# Patient Record
Sex: Female | Born: 1960 | Race: White | Hispanic: No | Smoking: Former smoker
Health system: Southern US, Community
[De-identification: ages and names within clinical notes are randomized; demographics above are authoritative.]

## PROBLEM LIST (undated history)

## (undated) DIAGNOSIS — D649 Anemia, unspecified: Secondary | ICD-10-CM

## (undated) DIAGNOSIS — M19019 Primary osteoarthritis, unspecified shoulder: Secondary | ICD-10-CM

## (undated) DIAGNOSIS — J189 Pneumonia, unspecified organism: Secondary | ICD-10-CM

## (undated) DIAGNOSIS — K219 Gastro-esophageal reflux disease without esophagitis: Secondary | ICD-10-CM

## (undated) DIAGNOSIS — M25511 Pain in right shoulder: Secondary | ICD-10-CM

## (undated) DIAGNOSIS — K449 Diaphragmatic hernia without obstruction or gangrene: Secondary | ICD-10-CM

## (undated) DIAGNOSIS — G4730 Sleep apnea, unspecified: Secondary | ICD-10-CM

## (undated) DIAGNOSIS — M5030 Other cervical disc degeneration, unspecified cervical region: Secondary | ICD-10-CM

## (undated) DIAGNOSIS — M62838 Other muscle spasm: Secondary | ICD-10-CM

## (undated) DIAGNOSIS — Z9889 Other specified postprocedural states: Secondary | ICD-10-CM

## (undated) DIAGNOSIS — R112 Nausea with vomiting, unspecified: Secondary | ICD-10-CM

## (undated) HISTORY — PX: SHOULDER SURGERY: SHX246

## (undated) HISTORY — PX: FERTILITY SURGERY: SHX945

## (undated) HISTORY — PX: OTHER SURGICAL HISTORY: SHX169

## (undated) HISTORY — DX: Pneumonia, unspecified organism: J18.9

---

## 2005-11-22 ENCOUNTER — Encounter: Admission: RE | Admit: 2005-11-22 | Discharge: 2005-11-22 | Payer: Self-pay | Admitting: Internal Medicine

## 2005-11-30 ENCOUNTER — Other Ambulatory Visit: Admission: RE | Admit: 2005-11-30 | Discharge: 2005-11-30 | Payer: Self-pay | Admitting: Obstetrics and Gynecology

## 2005-12-05 ENCOUNTER — Encounter: Admission: RE | Admit: 2005-12-05 | Discharge: 2005-12-05 | Payer: Self-pay | Admitting: Internal Medicine

## 2006-01-18 ENCOUNTER — Emergency Department (HOSPITAL_COMMUNITY): Admission: EM | Admit: 2006-01-18 | Discharge: 2006-01-18 | Payer: Self-pay | Admitting: Emergency Medicine

## 2009-09-07 ENCOUNTER — Ambulatory Visit (HOSPITAL_COMMUNITY): Admission: RE | Admit: 2009-09-07 | Discharge: 2009-09-07 | Payer: Self-pay | Admitting: Internal Medicine

## 2009-09-11 ENCOUNTER — Emergency Department (HOSPITAL_COMMUNITY): Admission: EM | Admit: 2009-09-11 | Discharge: 2009-09-11 | Payer: Self-pay | Admitting: Emergency Medicine

## 2009-11-16 ENCOUNTER — Encounter: Payer: Self-pay | Admitting: Orthopedic Surgery

## 2009-11-16 ENCOUNTER — Ambulatory Visit (HOSPITAL_COMMUNITY): Admission: RE | Admit: 2009-11-16 | Discharge: 2009-11-16 | Payer: Self-pay | Admitting: Internal Medicine

## 2009-12-08 ENCOUNTER — Ambulatory Visit: Payer: Self-pay | Admitting: Orthopedic Surgery

## 2009-12-08 DIAGNOSIS — M19019 Primary osteoarthritis, unspecified shoulder: Secondary | ICD-10-CM | POA: Insufficient documentation

## 2009-12-12 ENCOUNTER — Encounter: Payer: Self-pay | Admitting: Orthopedic Surgery

## 2009-12-16 ENCOUNTER — Ambulatory Visit: Payer: Self-pay | Admitting: Orthopedic Surgery

## 2009-12-16 ENCOUNTER — Ambulatory Visit (HOSPITAL_COMMUNITY): Admission: RE | Admit: 2009-12-16 | Discharge: 2009-12-16 | Payer: Self-pay | Admitting: Orthopedic Surgery

## 2009-12-19 ENCOUNTER — Telehealth: Payer: Self-pay | Admitting: Orthopedic Surgery

## 2009-12-19 ENCOUNTER — Encounter: Payer: Self-pay | Admitting: Orthopedic Surgery

## 2009-12-20 ENCOUNTER — Ambulatory Visit: Payer: Self-pay | Admitting: Orthopedic Surgery

## 2009-12-20 DIAGNOSIS — M75120 Complete rotator cuff tear or rupture of unspecified shoulder, not specified as traumatic: Secondary | ICD-10-CM | POA: Insufficient documentation

## 2009-12-26 ENCOUNTER — Encounter (HOSPITAL_COMMUNITY): Admission: RE | Admit: 2009-12-26 | Discharge: 2010-01-25 | Payer: Self-pay | Admitting: Orthopedic Surgery

## 2009-12-26 ENCOUNTER — Encounter: Payer: Self-pay | Admitting: Orthopedic Surgery

## 2009-12-27 ENCOUNTER — Ambulatory Visit: Payer: Self-pay | Admitting: Orthopedic Surgery

## 2010-01-23 ENCOUNTER — Encounter: Payer: Self-pay | Admitting: Orthopedic Surgery

## 2010-01-25 ENCOUNTER — Ambulatory Visit: Payer: Self-pay | Admitting: Orthopedic Surgery

## 2010-03-08 ENCOUNTER — Ambulatory Visit: Payer: Self-pay | Admitting: Orthopedic Surgery

## 2010-08-10 ENCOUNTER — Ambulatory Visit (HOSPITAL_COMMUNITY): Admission: RE | Admit: 2010-08-10 | Discharge: 2010-08-10 | Payer: Self-pay | Admitting: Gastroenterology

## 2010-09-18 ENCOUNTER — Encounter: Payer: Self-pay | Admitting: Gastroenterology

## 2010-09-27 ENCOUNTER — Ambulatory Visit (HOSPITAL_COMMUNITY)
Admission: RE | Admit: 2010-09-27 | Discharge: 2010-09-27 | Payer: Self-pay | Source: Home / Self Care | Attending: Gastroenterology | Admitting: Gastroenterology

## 2010-09-27 HISTORY — PX: ESOPHAGOGASTRODUODENOSCOPY: SHX1529

## 2010-10-04 ENCOUNTER — Encounter (HOSPITAL_COMMUNITY)
Admission: RE | Admit: 2010-10-04 | Discharge: 2010-11-03 | Payer: Self-pay | Source: Home / Self Care | Attending: Gastroenterology | Admitting: Gastroenterology

## 2010-11-12 ENCOUNTER — Encounter: Payer: Self-pay | Admitting: Internal Medicine

## 2010-11-21 NOTE — Assessment & Plan Note (Signed)
Summary: 4 WK RE-CK SHOULDER/POST OP RC SURG 12/16/09/[facility] DISC/CAF   Visit Type:  Follow-up Referring Provider:  [facility] Primary Provider:  [facility]  CC:  post op shoulder.  History of Present Illness: I saw Suzzette Kresse in the office today for a followup visit.  She is a 50 years old woman with the complaint of:  DOS 12/16/09 right shoulder.  POD  42  Procedure  Arthroscopic subacromial decompression and distal clavicle excision and mini open rotator cuf repair right shoulder.  doing very well has near full range of motion in all planes no pain  Recommend home therapy with therapy and follow up 4 weeks        Allergies: 1)  ! Sulfa 2)  ! 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

## 2010-11-21 NOTE — Medication Information (Signed)
Summary: Copy of Tylenol #3 prescription  Copy of Tylenol #3 prescription   Imported By: Jacklynn Ganong 12/20/2009 11:53:03  _____________________________________________________________________  External Attachment:    Type:   Image     Comment:   External Document

## 2010-11-21 NOTE — Assessment & Plan Note (Signed)
Summary: 6 WK RE-CK SHOULDER/POST OP RC SURG 12/16/09/[facility] DISCOUNT/CAF   Visit Type:  Follow-up Referring Provider:  [facility] Primary Provider:  [facility]  CC:  post op shoulder.  History of Present Illness: 50 year old female had a RIGHT shoulder subacromial decompression and distal clavicle excision with mini open rotator cuff repair on December 16, 2009.  she is on a home exercise program this is postoperative #12 she has some discomfort and pain every now.        Allergies: 1)  ! Sulfa 2)  ! * Decadron  Physical Exam  Additional Exam:  examination RIGHT shoulder, skin incision is healed no tenderness no swelling or tenderness around the shoulder.  Full forward elevation.  Grade 5 motor strength in the supraspinatus tendon.  No impingement.     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

## 2010-11-21 NOTE — Progress Notes (Signed)
Summary: patient req order Tyl 3 Rx through pharmacy  Phone Note Call from Patient   Caller: Patient Action Taken: Patient advised to call 911 Summary of Call: Patient wants the Tylenol w/Codiene medication to be ordered through [facility] in [place], since unable to get through [facility] Initial call taken by: Cammie Sickle,  December 19, 2009 2:04 PM  Follow-up for Phone Call        faxed to [place] Follow-up by: Ether Griffins,  December 19, 2009 2:05 PM

## 2010-11-21 NOTE — Progress Notes (Signed)
Summary: [facility] does not order narcotic Rx   Phone Note From Other Clinic   Summary of Call: Olegario Messier from [facility] called back about prescriptions called in.  States that they do not order narcotics, as the Tylenol 3 Rx contains.  The patient may opt to pay for the prescription if she wishes to have filled on her own. At [facility]ras if any questions, or Olegario Messier, at [phone].    - I have contacted patient and have advised.  Patient states she gets nausea with codeine, but does want to get the Rx. Please advise. Initial call taken by: Cammie Sickle,  December 19, 2009 1:42 PM

## 2010-11-21 NOTE — Miscellaneous (Signed)
Summary: Rehab Report  Rehab Report   Imported By: Elvera Maria 01/06/2010 10:40:33  _____________________________________________________________________  External Attachment:    Type:   Image     Comment:   ot eval

## 2010-11-21 NOTE — Assessment & Plan Note (Signed)
Summary: POST OP 2/SURG 12/16/09/[facility] DISC/CAF   Visit Type:  Follow-up Referring Provider:  [facility] Primary Provider:  [facility]  CC:  POST OP 2 SHOULDER.  History of Present Illness: I saw Alyssa Romero in the office today for a followup visit.  She is a 50 years old woman with the complaint of:  DOS 12/16/09 right shoulder.  POD # 11  Procedure  Arthroscopic subacromial decompression and distal clavicle excision and mini open rotator cuf repair right shoulder.  Medication Ibuprofen 800mg  does not help much, but she is better, still has pain going down right arm anterior arm deep pain, has alot of pain in the am.  One week recheck right shoulder after PT   Patient has removed her sling  Patient demonstrated 90 of abduction in the RIGHT shoulder.  Incision looks good suture was clipped patient should continue therapy followup 4 weeks        Allergies: 1)  ! Sulfa 2)  ! 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

## 2010-11-21 NOTE — Assessment & Plan Note (Signed)
Summary: POST OP 1/RC RT/SURG 12/16/09/[facility] DISC/CAF   Visit Type:  Follow-up Referring Provider:  [facility] Primary Provider:  [facility]  CC:  post op 1 shoulder.  History of Present Illness: I saw Alyssa Romero in the office today for a followup visit.  She is a 50 years old woman with the complaint of:  DOS 12/16/09 right shoulder.  Procedure  Arthroscopic subacromial decompression and distal clavicle excision and mini open rotator cuf repair right shoulder.  Medication Tylenol 3 and Phenergan.  Subjectives post op 1 check incision.  all pain medicines make her nauseous we try Zofran and Phenergan she is now on ibuprofen 800 q.6 hours for pain  She is placed in a regular sling  She'll start physical therapy  Come back in a week for stitch removal   Allergies: 1)  ! Sulfa 2)  ! 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

## 2010-11-21 NOTE — Assessment & Plan Note (Signed)
Summary: RT SHOULDER PAIN/HAD MRI [facility],BRING'G FILM/100% [facility] DISC/REF FR...   Vital Signs:  Patient profile:   50 year old female Weight:      240 pounds Pulse rate:   80 / minute Resp:     16 per minute  Vitals Entered By: Fuller Canada MD (December 08, 2009 9:36 AM)  Visit Type:  Initial Consult Referring Provider:  [facility] Primary Provider:  [facility]  CC:  right shoulder pain.  History of Present Illness: I saw Alyssa Romero in the office today for an initial visit.  She is a 50 years old woman with the complaint of:  right shoulder pain.  The [facility] and she is on 100% [facility] discount  She complains of 2 months history of sharp and dull throbbing constant severe RIGHT shoulder pain worsening over time which started gradually and is unrelieved by ibuprofen 600.  Her symptoms are worse with lifting her arm twisting or arm or lying on her RIGHT shoulder  She does report numbness tingling and catching.  The pain is over the RIGHT shoulder and radiates into the upper arm around the deltoid.  She does a lot of farming.  She did get an injection of cortisone did not relieve her pain is not had any physical therapy.  She does complain of some pain over the a.c. joint as well.  She is already had an MRI. 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    Meds: Zantac, Motrin 600 as needed.   Will have xrays today.  Allergies (verified): 1)  ! Sulfa 2)  ! 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 Cardiac :  Denies chest pain, angina, heart attack, heart failure, poor circulation, blood clots, and phlebitis. Resp:  Denies short of breath, difficulty breathing, COPD, cough, and pneumonia. GI:  Complains of nausea, vomiting, and reflux; denies diarrhea, constipation, difficulty swallowing, ulcers, and GERD. GU:  Denies kidney failure, kidney transplant, kidney stones, burning, poor stream, testicular cancer, blood in urine, and . Neuro:  Denies headache, dizziness, migraines, numbness, weakness, tremor, and unsteady walking. MS:  Complains of joint pain; denies rheumatoid arthritis, joint swelling, gout, bone cancer, osteoporosis, and . Endo:  Denies thyroid disease, goiter, and diabetes. Psych:  Denies depression, mood swings, anxiety, panic attack, bipolar, and schizophrenia. Derm:  Denies eczema, cancer, and itching. EENT:  Denies poor vision, cataracts, glaucoma, poor hearing, vertigo, ears ringing, sinusitis, hoarseness, toothaches, and bleeding gums. Immunology:  Denies seasonal allergies, sinus problems, and allergic to bee stings. Lymphatic:  Denies lymph node cancer and lymph edema.  Physical Exam  Additional Exam:  GEN: well developed, well nourished, normal grooming and hygiene, no deformity and normal body habitus.   CDV: pulses are normal, no edema, no erythema. no tenderness  Lymph: normal lymph nodes   Skin: no rashes, skin lesions or open sores   NEURO: normal coordination, reflexes, sensation.   Psyche: awake, alert and oriented. Mood normal   Gait: normal  RIGHT shoulder tenderness noted over the a.c.  joint she has pain across the chest with adduction of the arm shows a positive impingement sign no instability painful range of motion which is limited in an improved after subacromial injection of lidocaine, motor exam seems normal.  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 joint. Arthroscopy RIGHT shoulder with open Mumford procedure  Orders: New Patient Level IV (09811) Shoulder x-ray,  minimum 2 views (91478)  2 views RIGHT shoulder show no evidence of type II or 3 acromion and normal glenohumeral joint   Joint Aspirate / Injection, Large (20610) Depo- Medrol 40mg  (J1030)  The RIGHT shoulder was injected Verbal consent obtained/The shoulder was injected with depomedrol 40mg /cc and sensorcaine .25% . There were no complications  Patient Instructions: 1)  DOS 12/16/09 2)  Preop at [facility] on Tuesday 12/13/09 at 12:45, take packet with you. 3)  Post op 1 in our office on 12/20/09

## 2010-11-21 NOTE — Letter (Signed)
Summary: EGD ORDER  EGD ORDER   Imported By: Ave Filter 09/18/2010 12:38:24  _____________________________________________________________________  External Attachment:    Type:   Image     Comment:   External Document

## 2010-11-21 NOTE — Letter (Signed)
Summary: History form  History form   Imported By: Jacklynn Ganong 12/14/2009 08:22:03  _____________________________________________________________________  External Attachment:    Type:   Image     Comment:   External Document

## 2010-11-21 NOTE — Miscellaneous (Signed)
Summary: OT Clinical evaluation  OT Clinical evaluation   Imported By: Jacklynn Ganong 01/09/2010 09:19:00  _____________________________________________________________________  External Attachment:    Type:   Image     Comment:   External Document

## 2010-11-21 NOTE — Letter (Signed)
Summary: Surgery order Rt shoulder sched 12/16/09  Surgery order Rt shoulder sched 12/16/09   Imported By: Cammie Sickle 01/17/2010 11:19:28  _____________________________________________________________________  External Attachment:    Type:   Image     Comment:   External Document

## 2010-11-21 NOTE — Progress Notes (Signed)
Summary: pain meds made her nauseaous  Phone Note Call from Patient   Summary of Call: Alyssa Romero (1961/03/15) says she cannot take the pain med you prescribed. She was taking the Hydrocodone and generic Robaxin but stopped both of them because one of them was giving her a severe headache and made her very nauseaous and the generic Zofran did not help the nausea.  Says she is in a lot of pain and needs you to prescibe something else for her pain.  Her [facility] [phone] Her # is [phone] Initial call taken by: Jacklynn Ganong,  December 19, 2009 10:49 AM  Follow-up for Phone Call        tylenol # 3  1 q 4 as needed pain  #30   [this is not as strong so she will still have some pain]  phenergan 25 mg q 6 scheduled  # 30  Follow-up by: Fuller Canada MD,  December 19, 2009 11:57 AM  Additional Follow-up for Phone Call Additional follow up Details #1::        Routed to [place] to call to [facility] [phone] Additional Follow-up by: Jacklynn Ganong,  December 19, 2009 12:31 PM    Additional Follow-up for Phone Call Additional follow up Details #2::    [facility] into [facility] clinic Follow-up by: Ether Griffins,  December 19, 2009 1:20 PM  New/Updated Medications: TYLENOL WITH CODEINE #3 300-30 MG TABS (ACETAMINOPHEN-CODEINE) one by mouth q 4 hrs as needed pain PROMETHAZINE HCL 25 MG TABS (PROMETHAZINE HCL) one by mouth q 6 hrs as needed Prescriptions: PROMETHAZINE HCL 25 MG TABS (PROMETHAZINE HCL) one by mouth q 6 hrs as needed  #30 x 0   Entered by:   Ether Griffins   Authorized by:   Fuller Canada MD   Signed by:   Ether Griffins on 12/19/2009   Method used:   Historical   RxID:   [id number] TYLENOL WITH CODEINE #3 300-30 MG TABS (ACETAMINOPHEN-CODEINE) one by mouth q 4 hrs as needed pain  #30 x 0   Entered by:   Ether Griffins   Authorized by:   Fuller Canada MD   Signed by:   Ether Griffins on 12/19/2009   Method used:   Historical   RxID:   [id number]

## 2010-11-21 NOTE — Medication Information (Signed)
Summary: Tax adviser   Imported By: Cammie Sickle 01/17/2010 11:17:21  _____________________________________________________________________  External Attachment:    Type:   Image     Comment:   External Document

## 2010-11-21 NOTE — Miscellaneous (Signed)
Summary: OT progress note  OT progress note   Imported By: Jacklynn Ganong 01/26/2010 07:58:11  _____________________________________________________________________  External Attachment:    Type:   Image     Comment:   External Document

## 2010-12-15 ENCOUNTER — Encounter (INDEPENDENT_AMBULATORY_CARE_PROVIDER_SITE_OTHER): Payer: Self-pay | Admitting: *Deleted

## 2010-12-19 NOTE — Letter (Signed)
Summary: Recall Office Visit  [facility]  [address]   [place], [place] [address]   Phone: [phone]  Fax: [phone]      December 15, 2010   [facility] [address] [place], [place]  [address] 1961-01-26   Dear Ms. Kosier,   According to our records, it is time for you to schedule a follow-up office visit with [place].   At your convenience, please call [phone] to schedule an office visit. If you have any questions, concerns, or feel that this letter is in error, we would appreciate your call.   Sincerely,    Diana Eves  [facility] Ph: [phone]   Fax: [phone]

## 2011-01-10 ENCOUNTER — Ambulatory Visit: Payer: Self-pay | Attending: Neurology

## 2011-01-10 DIAGNOSIS — R0989 Other specified symptoms and signs involving the circulatory and respiratory systems: Secondary | ICD-10-CM | POA: Insufficient documentation

## 2011-01-10 DIAGNOSIS — R0609 Other forms of dyspnea: Secondary | ICD-10-CM | POA: Insufficient documentation

## 2011-01-10 DIAGNOSIS — R5383 Other fatigue: Secondary | ICD-10-CM | POA: Insufficient documentation

## 2011-01-10 DIAGNOSIS — R5381 Other malaise: Secondary | ICD-10-CM | POA: Insufficient documentation

## 2011-01-10 DIAGNOSIS — G4733 Obstructive sleep apnea (adult) (pediatric): Secondary | ICD-10-CM | POA: Insufficient documentation

## 2011-01-11 LAB — BASIC METABOLIC PANEL
CO2: 31 mEq/L (ref 19–32)
Calcium: 9.4 mg/dL (ref 8.4–10.5)
Creatinine, Ser: 0.69 mg/dL (ref 0.4–1.2)
GFR calc Af Amer: >60 mL/min (ref >60)

## 2011-01-11 LAB — HEMOGLOBIN AND HEMATOCRIT, BLOOD
HCT: 40 % (ref 36.0–46.0)
Hemoglobin: 13.7 g/dL (ref 12.0–15.0)

## 2012-04-17 ENCOUNTER — Other Ambulatory Visit (HOSPITAL_COMMUNITY): Payer: Self-pay | Admitting: Internal Medicine

## 2012-04-17 DIAGNOSIS — R1310 Dysphagia, unspecified: Secondary | ICD-10-CM

## 2012-04-22 ENCOUNTER — Ambulatory Visit (HOSPITAL_COMMUNITY)
Admission: RE | Admit: 2012-04-22 | Discharge: 2012-04-22 | Disposition: A | Discharge status: Home / Self Care | Payer: Self-pay | Source: Ambulatory Visit | Attending: Internal Medicine | Admitting: Internal Medicine

## 2012-04-22 DIAGNOSIS — R4789 Other speech disturbances: Secondary | ICD-10-CM | POA: Insufficient documentation

## 2012-04-22 DIAGNOSIS — K219 Gastro-esophageal reflux disease without esophagitis: Secondary | ICD-10-CM | POA: Insufficient documentation

## 2012-04-22 DIAGNOSIS — K449 Diaphragmatic hernia without obstruction or gangrene: Secondary | ICD-10-CM | POA: Insufficient documentation

## 2012-04-22 DIAGNOSIS — R1310 Dysphagia, unspecified: Secondary | ICD-10-CM | POA: Insufficient documentation

## 2012-11-30 ENCOUNTER — Emergency Department (HOSPITAL_COMMUNITY): Payer: BC Managed Care – PPO

## 2012-11-30 ENCOUNTER — Emergency Department (HOSPITAL_COMMUNITY)
Admission: EM | Admit: 2012-11-30 | Discharge: 2012-11-30 | Disposition: A | Discharge status: Home / Self Care | Payer: BC Managed Care – PPO | Attending: Emergency Medicine | Admitting: Emergency Medicine

## 2012-11-30 ENCOUNTER — Encounter (HOSPITAL_COMMUNITY): Payer: Self-pay

## 2012-11-30 DIAGNOSIS — Y929 Unspecified place or not applicable: Secondary | ICD-10-CM | POA: Insufficient documentation

## 2012-11-30 DIAGNOSIS — W1789XA Other fall from one level to another, initial encounter: Secondary | ICD-10-CM | POA: Insufficient documentation

## 2012-11-30 DIAGNOSIS — Z79899 Other long term (current) drug therapy: Secondary | ICD-10-CM | POA: Insufficient documentation

## 2012-11-30 DIAGNOSIS — S82899A Other fracture of unspecified lower leg, initial encounter for closed fracture: Secondary | ICD-10-CM

## 2012-11-30 DIAGNOSIS — Y9301 Activity, walking, marching and hiking: Secondary | ICD-10-CM | POA: Insufficient documentation

## 2012-11-30 DIAGNOSIS — IMO0002 Reserved for concepts with insufficient information to code with codable children: Secondary | ICD-10-CM | POA: Insufficient documentation

## 2012-11-30 DIAGNOSIS — Z23 Encounter for immunization: Secondary | ICD-10-CM | POA: Insufficient documentation

## 2012-11-30 DIAGNOSIS — S80212A Abrasion, left knee, initial encounter: Secondary | ICD-10-CM

## 2012-11-30 MED ORDER — TETANUS-DIPHTH-ACELL PERTUSSIS 5-2.5-18.5 LF-MCG/0.5 IM SUSP
0.5000 mL | Freq: Once | INTRAMUSCULAR | Status: AC
  Administered 2012-11-30: 0.5 mL via INTRAMUSCULAR
  Filled 2012-11-30: qty 0.5

## 2012-11-30 NOTE — ED Notes (Signed)
Pt lost balance and fell injured left knee and right ankle. Denies loc. No other injuries

## 2012-11-30 NOTE — ED Provider Notes (Signed)
History     CSN: [id number]  Arrival date & time 11/30/12  1420   First MD Initiated Contact with Patient 11/30/12 1510      Chief Complaint  Patient presents with  . Fall    (Consider location/radiation/quality/duration/timing/severity/associated sxs/prior treatment) Patient is a 52 y.o. female presenting with fall. The history is provided by the patient.  Fall The accident occurred 1 to 2 hours ago. The fall occurred while walking. She fell from a height of 1 to 2 ft. She landed on dirt. The volume of blood lost was minimal. The point of impact was the left knee. The pain is present in the left knee (right ankle). The pain is moderate. She was ambulatory at the scene. Pertinent negatives include no visual change, no numbness, no abdominal pain, no bowel incontinence, no hematuria and no loss of consciousness. The symptoms are aggravated by ambulation and standing. She has tried nothing for the symptoms. The treatment provided no relief.    History reviewed. No pertinent past medical history.  Past Surgical History  Procedure Laterality Date  . Lymphectomy      No family history on file.  History  Substance Use Topics  . Smoking status: Not on file  . Smokeless tobacco: Not on file  . Alcohol Use: Yes     Comment: occ    OB History   Grav Para Term Preterm Abortions TAB SAB Ect Mult Living                  Review of Systems  Constitutional: Negative for activity change.       All ROS Neg except as noted in HPI  HENT: Negative for nosebleeds and neck pain.   Eyes: Negative for photophobia and discharge.  Respiratory: Negative for cough, shortness of breath and wheezing.   Cardiovascular: Negative for chest pain and palpitations.  Gastrointestinal: Negative for abdominal pain, blood in stool and bowel incontinence.  Genitourinary: Negative for dysuria, frequency and hematuria.  Musculoskeletal: Negative for back pain and arthralgias.  Skin: Negative.    Neurological: Negative for dizziness, seizures, loss of consciousness, speech difficulty and numbness.  Psychiatric/Behavioral: Negative for hallucinations and confusion.    Allergies  Decadron and Sulfonamide derivatives  Home Medications   Current Outpatient Rx  Name  Route  Sig  Dispense  Refill  . Cholecalciferol (VITAMIN D PO)   Oral   Take 1 tablet by mouth daily.         Marland Kitchen estradiol (VIVELLE-DOT) 0.05 MG/24HR   Transdermal   Place 0.5 patches onto the skin 2 (two) times a week.         Marland Kitchen liothyronine (CYTOMEL) 25 MCG tablet   Oral   Take 25 mcg by mouth daily.         . Multiple Vitamins-Minerals (MULTIVITAMINS THER. W/MINERALS) TABS   Oral   Take 1 tablet by mouth daily.         . NON FORMULARY   Oral   Take 1 capsule by mouth 2 (two) times daily. Compounded progesterone capsule         . Nutritional Supplements (DHEA PO)   Oral   Take 1 capsule by mouth daily.         . vitamin A 40981 UNIT capsule   Oral   Take 75,000 Units by mouth daily.           BP 119/71  Pulse 114  Temp(Src) 97.9 F (36.6 C) (Oral)  Resp 20  Ht 5\' 7"  (1.702 m)  Wt 220 lb (99.791 kg)  BMI 34.45 kg/m2  SpO2 98%  Physical Exam  Nursing note and vitals reviewed. Constitutional: She is oriented to person, place, and time. She appears well-developed and well-nourished.  Non-toxic appearance.  HENT:  Head: Normocephalic.  Right Ear: Tympanic membrane and external ear normal.  Left Ear: Tympanic membrane and external ear normal.  Eyes: EOM and lids are normal. Pupils are equal, round, and reactive to light.  Neck: Normal range of motion. Neck supple. Carotid bruit is not present.  Cardiovascular: Normal rate, regular rhythm, normal heart sounds, intact distal pulses and normal pulses.   Pulmonary/Chest: Breath sounds normal. No respiratory distress.  Abdominal: Soft. Bowel sounds are normal. There is no tenderness. There is no guarding.  Musculoskeletal: Normal  range of motion.  There are abrasions of the left knee. Leading [place] controlled. Is good range of motion of the left hip knee ankle and toes. The Achilles tendon is intact.  There is good range of motion of the right hip and knee. There is pain and swelling of the lateral malleolus on the right. There is pain with flexing and extending the toes. Is no swelling of the metatarsal heads. Capillary refill of the right and left toes is less than 3 seconds.  Lymphadenopathy:       Head (right side): No submandibular adenopathy present.       Head (left side): No submandibular adenopathy present.    She has no cervical adenopathy.  Neurological: She is alert and oriented to person, place, and time. She has normal strength. No cranial nerve deficit or sensory deficit.  Skin: Skin is warm and dry.  Psychiatric: She has a normal mood and affect. Her speech is normal.    ED Course  Procedures (including critical care time)  Labs Reviewed - No data to display Dg Ankle Complete Right  11/30/2012  *RADIOLOGY REPORT*  Clinical Data: Recent fall with swelling  RIGHT ANKLE - COMPLETE 3+ VIEW  Comparison: None.  Findings: No acute fracture is seen.  The ankle joint appears normal in alignment is normal.  There is soft tissue swelling of the lateral malleolus.  There does appear to be a thin bone fragment from the tip of the distal right fibula consistent with a small avulsion fracture fragment.  IMPRESSION:  Probable small avulsion fracture from the tip of the distal right fibula.   Original Report Authenticated By: Dwyane Dee, M.D.      No diagnosis found.    MDM  I have reviewed nursing notes, vital signs, and all appropriate lab and imaging results for this patient. The x-ray of the right ankle reveals a small avulsion fracture at the tip of the right fibula. The findings have been discussed with the patient in terms which he understands. The patient is fitted with a ankle stirrup splint and crutches. Ice  pack is also been provided. Pain management was offered to the patient. She requests to use extra strength Tylenol. Patient is advised to keep the right ankle elevated above the waist. Apply ice. Use crutches when up and about. And followup with orthopedics.       Kathie Dike, [place] 12/04/12 2210                                     Medical screening examination/treatment/procedure(s) were performed by non-physician practitioner and as supervising  physician I was immediately available for consultation/collaboration.   Benny Lennert, MD 01/15/13 [phone]

## 2013-09-17 ENCOUNTER — Emergency Department (HOSPITAL_COMMUNITY)
Admission: EM | Admit: 2013-09-17 | Discharge: 2013-09-17 | Disposition: A | Discharge status: Home / Self Care | Payer: BC Managed Care – PPO | Attending: Emergency Medicine | Admitting: Emergency Medicine

## 2013-09-17 ENCOUNTER — Encounter (HOSPITAL_COMMUNITY): Payer: Self-pay | Admitting: Emergency Medicine

## 2013-09-17 DIAGNOSIS — X58XXXA Exposure to other specified factors, initial encounter: Secondary | ICD-10-CM | POA: Insufficient documentation

## 2013-09-17 DIAGNOSIS — M546 Pain in thoracic spine: Secondary | ICD-10-CM | POA: Insufficient documentation

## 2013-09-17 DIAGNOSIS — IMO0002 Reserved for concepts with insufficient information to code with codable children: Secondary | ICD-10-CM | POA: Insufficient documentation

## 2013-09-17 DIAGNOSIS — Z79899 Other long term (current) drug therapy: Secondary | ICD-10-CM | POA: Insufficient documentation

## 2013-09-17 DIAGNOSIS — Y929 Unspecified place or not applicable: Secondary | ICD-10-CM | POA: Insufficient documentation

## 2013-09-17 DIAGNOSIS — F411 Generalized anxiety disorder: Secondary | ICD-10-CM | POA: Insufficient documentation

## 2013-09-17 DIAGNOSIS — Y939 Activity, unspecified: Secondary | ICD-10-CM | POA: Insufficient documentation

## 2013-09-17 DIAGNOSIS — T148XXA Other injury of unspecified body region, initial encounter: Secondary | ICD-10-CM

## 2013-09-17 MED ORDER — ONDANSETRON HCL 4 MG PO TABS: 4.0000 mg | ORAL_TABLET | Freq: Four times a day (QID) | ORAL | Status: DC

## 2013-09-17 MED ORDER — HYDROCODONE-ACETAMINOPHEN 5-325 MG PO TABS: 1.0000 | ORAL_TABLET | ORAL | Status: DC | PRN

## 2013-09-17 MED ORDER — LORAZEPAM 1 MG PO TABS
1.0000 mg | ORAL_TABLET | Freq: Once | ORAL | Status: AC
  Administered 2013-09-17: 1 mg via ORAL
  Filled 2013-09-17: qty 1

## 2013-09-17 MED ORDER — ONDANSETRON 4 MG PO TBDP
4.0000 mg | ORAL_TABLET | Freq: Once | ORAL | Status: AC
  Administered 2013-09-17: 4 mg via ORAL
  Filled 2013-09-17: qty 1

## 2013-09-17 MED ORDER — HYDROMORPHONE HCL PF 1 MG/ML IJ SOLN
1.0000 mg | Freq: Once | INTRAMUSCULAR | Status: AC
  Administered 2013-09-17: 1 mg via INTRAMUSCULAR
  Filled 2013-09-17: qty 1

## 2013-09-17 MED ORDER — IBUPROFEN 400 MG PO TABS
600.0000 mg | ORAL_TABLET | Freq: Once | ORAL | Status: AC
  Administered 2013-09-17: 600 mg via ORAL
  Filled 2013-09-17: qty 2

## 2013-09-17 MED ORDER — IBUPROFEN 600 MG PO TABS: 600.0000 mg | ORAL_TABLET | Freq: Four times a day (QID) | ORAL | Status: DC | PRN

## 2013-09-17 NOTE — ED Provider Notes (Signed)
7:05 AM  Assumed care from Dr. Ranae Palms.  Pt here with left-sided posterior shoulder muscle strain, spasm. Tender to palpation over her left posterior trapezius and rhomboid musculature. No bony tenderness. NV intact.  No imaging indicated at this time. We'll give pain medication and reassess.  7:36 PM  Patient's pain is markedly improved and she states is much more bearable. She describes as a 2/10. She reports she is ready for discharge. Have instructed her to followup with her primary care physician next week if her pain continues. Given return precautions. We'll discharge home with Norco, ibuprofen. Patient verbalizes understanding and is comfortable with plan.  Alyssa Romero Ward, DO 09/17/13 [phone]

## 2013-09-17 NOTE — ED Provider Notes (Signed)
CSN: [id number]     Arrival date & time 09/17/13  0601 History   First MD Initiated Contact with Patient 09/17/13 [phone]     Chief Complaint  Patient presents with  . Shoulder Pain    pinched nerve to left shoulder per patient. went to urgent care and was given flexeril and steroids and tylenol for pain.   (Consider location/radiation/quality/duration/timing/severity/associated sxs/prior Treatment) HPI Patient presents with left sided thoracic back pain radiating into her left shoulder. She's had the pain now for 4-5 days. She seen a chiropractor and she has been to an urgent care clinic. She's had x-rays performed which showed no bony abnormality. She was told she may have a pinched nerve. She has no shortness of breath or chest pain. Patient is quite anxious. She has no weakness or numbness in her hands or upper extremity she is taking Flexeril with little benefit. No past medical history on file. Past Surgical History  Procedure Laterality Date  . Lymphectomy     No family history on file. History  Substance Use Topics  . Smoking status: Not on file  . Smokeless tobacco: Not on file  . Alcohol Use: Yes     Comment: occ   OB History   Grav Para Term Preterm Abortions TAB SAB Ect Mult Living                 Review of Systems  Constitutional: Negative for fever and chills.  Respiratory: Negative for shortness of breath.   Cardiovascular: Negative for chest pain.  Gastrointestinal: Negative for nausea, vomiting and abdominal pain.  Musculoskeletal: Positive for back pain and myalgias. Negative for arthralgias, neck pain and neck stiffness.  Skin: Negative for rash and wound.  Neurological: Negative for dizziness, weakness, light-headedness, numbness and headaches.  All other systems reviewed and are negative.    Allergies  Decadron and Sulfonamide derivatives  Home Medications   Current Outpatient Rx  Name  Route  Sig  Dispense  Refill  . cyclobenzaprine (FLEXERIL) 10 MG  tablet   Oral   Take 10 mg by mouth 3 (three) times daily as needed for muscle spasms.         . Cholecalciferol (VITAMIN D PO)   Oral   Take 1 tablet by mouth daily.         Marland Kitchen estradiol (VIVELLE-DOT) 0.05 MG/24HR   Transdermal   Place 0.5 patches onto the skin 2 (two) times a week.         Marland Kitchen liothyronine (CYTOMEL) 25 MCG tablet   Oral   Take 25 mcg by mouth daily.         . Multiple Vitamins-Minerals (MULTIVITAMINS THER. W/MINERALS) TABS   Oral   Take 1 tablet by mouth daily.         . NON FORMULARY   Oral   Take 1 capsule by mouth 2 (two) times daily. Compounded progesterone capsule         . Nutritional Supplements (DHEA PO)   Oral   Take 1 capsule by mouth daily.         . vitamin A 56213 UNIT capsule   Oral   Take 75,000 Units by mouth daily.          BP 144/85  Pulse 94  Temp(Src) 98.4 F (36.9 C) (Oral)  Resp 20  Ht 5\' 7"  (1.702 m)  Wt 229 lb (103.874 kg)  BMI 35.86 kg/m2  SpO2 98% Physical Exam  Nursing note and vitals reviewed.  Constitutional: She is oriented to person, place, and time. She appears well-developed and well-nourished. No distress.  Patient is anxious and appears histrionic. She is tearful and walking around the room.  HENT:  Head: Normocephalic and atraumatic.  Mouth/Throat: Oropharynx is clear and moist.  Eyes: EOM are normal. Pupils are equal, round, and reactive to light.  Neck: Normal range of motion. Neck supple.  Patient has no midline cervical tenderness. She has no paraspinal cervical tenderness. She has full range of motion of her neck.  Cardiovascular: Normal rate and regular rhythm.   Pulmonary/Chest: Effort normal and breath sounds normal. No respiratory distress. She has no wheezes. She has no rales. She exhibits no tenderness.  Abdominal: Soft. Bowel sounds are normal. She exhibits no distension and no mass. There is no tenderness. There is no rebound and no guarding.  Musculoskeletal: Normal range of motion.  She exhibits tenderness (patient's pain is reproduced with palpation along the left trapezius and left rhomboid muscles. She has full range of motion of her shoulder without pain. Her distal pulses are intact.). She exhibits no edema.  Neurological: She is alert and oriented to person, place, and time.  She has 5/5 grip strength in both hands. Her sensation is fully intact.  Skin: Skin is warm and dry. No rash noted. No erythema.    ED Course  Procedures (including critical care time) Labs Review Labs Reviewed - No data to display Imaging Review No results found.  EKG Interpretation   None       MDM  There appears to be some psychiatric component the patient's complaint. Her symptoms her reproduced with palpation over the thoracic musculature. I suspect muscle strain causing her pain. She states that she gets nauseated with pain medication. We'll pretreat with Zofran and give ibuprofen and a dose of Ativan. We'll reevaluate.  Patient signed out to oncoming emergency physician pending reevaluation and discharge.  Loren Racer, MD 09/18/13 0201

## 2013-09-19 ENCOUNTER — Encounter (HOSPITAL_COMMUNITY): Payer: Self-pay | Admitting: Emergency Medicine

## 2013-09-19 ENCOUNTER — Emergency Department (HOSPITAL_COMMUNITY)
Admission: EM | Admit: 2013-09-19 | Discharge: 2013-09-19 | Disposition: A | Discharge status: Home / Self Care | Payer: BC Managed Care – PPO | Attending: Emergency Medicine | Admitting: Emergency Medicine

## 2013-09-19 ENCOUNTER — Emergency Department (HOSPITAL_COMMUNITY): Payer: BC Managed Care – PPO

## 2013-09-19 DIAGNOSIS — M542 Cervicalgia: Secondary | ICD-10-CM | POA: Insufficient documentation

## 2013-09-19 DIAGNOSIS — M778 Other enthesopathies, not elsewhere classified: Secondary | ICD-10-CM

## 2013-09-19 DIAGNOSIS — M5090 Cervical disc disorder, unspecified, unspecified cervical region: Secondary | ICD-10-CM | POA: Insufficient documentation

## 2013-09-19 DIAGNOSIS — M5030 Other cervical disc degeneration, unspecified cervical region: Secondary | ICD-10-CM

## 2013-09-19 DIAGNOSIS — M19019 Primary osteoarthritis, unspecified shoulder: Secondary | ICD-10-CM | POA: Insufficient documentation

## 2013-09-19 DIAGNOSIS — M67919 Unspecified disorder of synovium and tendon, unspecified shoulder: Secondary | ICD-10-CM | POA: Insufficient documentation

## 2013-09-19 DIAGNOSIS — M719 Bursopathy, unspecified: Secondary | ICD-10-CM | POA: Insufficient documentation

## 2013-09-19 DIAGNOSIS — Z8719 Personal history of other diseases of the digestive system: Secondary | ICD-10-CM | POA: Insufficient documentation

## 2013-09-19 HISTORY — DX: Gastro-esophageal reflux disease without esophagitis: K21.9

## 2013-09-19 HISTORY — DX: Other cervical disc degeneration, unspecified cervical region: M50.30

## 2013-09-19 HISTORY — DX: Diaphragmatic hernia without obstruction or gangrene: K44.9

## 2013-09-19 HISTORY — DX: Pain in right shoulder: M25.511

## 2013-09-19 HISTORY — DX: Other muscle spasm: M62.838

## 2013-09-19 HISTORY — DX: Primary osteoarthritis, unspecified shoulder: M19.019

## 2013-09-19 MED ORDER — ONDANSETRON HCL 4 MG PO TABS: 4.0000 mg | ORAL_TABLET | Freq: Three times a day (TID) | ORAL | Status: DC | PRN

## 2013-09-19 MED ORDER — FENTANYL CITRATE 0.05 MG/ML IJ SOLN
100.0000 ug | Freq: Once | INTRAMUSCULAR | Status: AC
  Administered 2013-09-19: 100 ug via INTRAMUSCULAR
  Filled 2013-09-19: qty 2

## 2013-09-19 MED ORDER — METHOCARBAMOL 500 MG PO TABS: 1000.0000 mg | ORAL_TABLET | Freq: Four times a day (QID) | ORAL | Status: DC | PRN

## 2013-09-19 MED ORDER — OXYCODONE-ACETAMINOPHEN 5-325 MG PO TABS: ORAL_TABLET | ORAL | Status: DC

## 2013-09-19 NOTE — ED Provider Notes (Signed)
CSN: [id number]     Arrival date & time 09/19/13  1422 History   First MD Initiated Contact with Patient 09/19/13 1638     Chief Complaint  Patient presents with  . Shoulder Pain    HPI Pt was seen at 1645. Per pt and her husband, c/o gradual onset and persistence of constant left sided posterior neck and shoulder "pain" for the past 1 week. States she woke up with symptoms. Describes the pain as "stabbing" and "burning."  Pain worsens with palpation of the area, movement of her left arm, and body position changes. Denies incont/retention of bowel or bladder, no saddle anesthesia, no focal motor weakness, no tingling/numbness in extremities, no visual changes, no facial droop, no slurred speech, no fevers, no rash, no injury, no CP/SOB pain. The symptoms have been associated with no other complaints. The patient has a significant history of similar symptoms previously, recently being evaluated for this complaint and multiple prior evals for same. Pt has been evaluated at an [facility], the ED and by her Chiropractor in the past week for these symptoms. Pt's family states pt's chiropractor "told her she might have a pinched nerve and a bone spur in her shoulder."      Ortho: Dr. Romeo Apple Past Medical History  Diagnosis Date  . Hiatal hernia   . GERD (gastroesophageal reflux disease)   . Pain in right shoulder   . Muscle spasm     "she has recurrent muscle spasms all over for as long as I've known her" per husband   Past Surgical History  Procedure Laterality Date  . Lymphectomy    . Shoulder surgery Right     History  Substance Use Topics  . Smoking status: Never Smoker   . Smokeless tobacco: Not on file  . Alcohol Use: Yes     Comment: occ    Review of Systems ROS: Statement: All systems negative except as marked or noted in the HPI; Constitutional: Negative for fever and chills. ; ; Eyes: Negative for eye pain, redness and discharge. ; ; ENMT: Negative for ear pain, hoarseness, nasal  congestion, sinus pressure and sore throat. ; ; Cardiovascular: Negative for chest pain, palpitations, diaphoresis, dyspnea and peripheral edema. ; ; Respiratory: Negative for cough, wheezing and stridor. ; ; Gastrointestinal: Negative for nausea, vomiting, diarrhea, abdominal pain, blood in stool, hematemesis, jaundice and rectal bleeding. . ; ; Genitourinary: Negative for dysuria, flank pain and hematuria. ; ; Musculoskeletal: +left neck and shoulder pain. Negative for back pain. Negative for swelling and trauma.; ; Skin: Negative for pruritus, rash, abrasions, blisters, bruising and skin lesion.; ; Neuro: Negative for headache, lightheadedness and neck stiffness. Negative for weakness, altered level of consciousness , altered mental status, extremity weakness, paresthesias, involuntary movement, seizure and syncope.       Allergies  Decadron and Sulfonamide derivatives  Home Medications   Current Outpatient Rx  Name  Route  Sig  Dispense  Refill  . cyclobenzaprine (FLEXERIL) 10 MG tablet   Oral   Take 10 mg by mouth 3 (three) times daily as needed for muscle spasms.         Marland Kitchen HYDROcodone-acetaminophen (NORCO) 5-325 MG per tablet   Oral   Take 1 tablet by mouth every 4 (four) hours as needed.   10 tablet   0   . ibuprofen (ADVIL,MOTRIN) 600 MG tablet   Oral   Take 1 tablet (600 mg total) by mouth every 6 (six) hours as needed.   30  tablet   0   . ondansetron (ZOFRAN) 4 MG tablet   Oral   Take 1 tablet (4 mg total) by mouth every 6 (six) hours.   12 tablet   0    BP 97/65  Pulse 90  Temp(Src) 97.1 F (36.2 C) (Oral)  Resp 18  Ht 5\' 7"  (1.702 m)  Wt 219 lb (99.338 kg)  BMI 34.29 kg/m2  SpO2 100% Physical Exam 1650: Physical examination:  Nursing notes reviewed; Vital signs and O2 SAT reviewed;  Constitutional: Well developed, Well nourished, Well hydrated, Tearful.; Head:  Normocephalic, atraumatic; Eyes: EOMI, PERRL, No scleral icterus; ENMT: Mouth and pharynx normal,  Mucous membranes moist; Neck: Supple, Full range of motion, No lymphadenopathy; Cardiovascular: Regular rate and rhythm, No gallop; Respiratory: Breath sounds clear & equal bilaterally, No wheezes.  Speaking full sentences with ease, Normal respiratory effort/excursion; Chest: Nontender, Movement normal; Abdomen: Soft, Nontender, Nondistended, Normal bowel sounds; Genitourinary: No CVA tenderness; Spine:  No midline CS, TS, LS tenderness. +TTP left hypertonic trapezius and rhomboid muscles. No rash.;; Extremities: Pulses normal, No tenderness left shoulder joint, FROM left shoulder. NMS intact bilat UE's. No edema, No calf edema or asymmetry.; Neuro: AA&Ox3, Major CN grossly intact.  Speech clear. No gross focal motor or sensory deficits in extremities.; Skin: Color normal, Warm, Dry.   ED Course  Procedures   EKG Interpretation   None       MDM  MDM Reviewed: previous chart, nursing note and vitals Interpretation: x-ray and CT scan    Ct Cervical Spine Wo Contrast 09/19/2013   CLINICAL DATA:  52 year old female with neck and left shoulder pain.  EXAM: CT CERVICAL SPINE WITHOUT CONTRAST  TECHNIQUE: Multidetector CT imaging of the cervical spine was performed without intravenous contrast. Multiplanar CT image reconstructions were also generated.  COMPARISON:  None.  FINDINGS: Normal alignment is noted.  There is no evidence of fracture, subluxation or prevertebral soft tissue swelling.  C2-3: Minimal degenerative disc disease and a small central disc protrusion noted which may barely contact the cord.  C3-4: Mild to moderate degenerative disc disease, spondylosis and mild broad-based disc bulge noted contributing to mild central spinal narrowing, mild to moderate left bony foraminal narrowing and mild right bony foraminal narrowing.  C4-5: Minimal degenerative disc disease and spondylosis noted with mild broad-based disc bulge contributing to mild central spinal and right bony foraminal narrowing.   C5-6: Minimal degenerative disc disease noted with mild broad-based disc bulge.  C6-C7: Mild-to-moderate degenerative disc disease and spondylosis noted. A broad-based disc bulge is noted with suggestion of a small to moderate focal left paracentral disc protrusion. Mild central spinal narrowing is present.  C7-T1: Minimal degenerative disc disease noted without central or foraminal narrowing.  IMPRESSION: Suggestion of small to moderate focal left paracentral disc protrusion at C6-C7 likely impinging on the adjacent left nerve root. Consider MRI for further evaluation as clinically indicated.  Multilevel degenerative changes contributing to central and foraminal narrowing as described above. At C3-C4, there is mild to moderate left bony foraminal narrowing.  No evidence of acute abnormality.   Electronically Signed   By: Laveda Abbe M.D.   On: 09/19/2013 17:50   Dg Shoulder Left 09/19/2013   CLINICAL DATA:  Left shoulder pain.  EXAM: LEFT SHOULDER - 2+ VIEW  COMPARISON:  CT chest 11/22/2005.  FINDINGS: No evidence of fracture, dislocation, or separation. Mild subacromial spurring present. Punctate calcification noted over the distribution of the supraspinatus tendon. Calcific supraspinatus tendinitis could present in  this fashion. Visualized left lung apex is normal. Adjacent left ribs are unremarkable.  IMPRESSION: 1. Mild acromioclavicular degenerative change with mild subacromial spurring. 2. Small calcifications in the distribution the supraspinatus tendon, calcific supraspinatus tendinitis could present in this fashion.   Electronically Signed   By: Maisie Fus  Register   On: 09/19/2013 17:38     1820:  Improved after pain meds. Neuro exam intact and unchanged. Wants to go home now. Will continue to tx symptomatically at this time. Dx and testing d/w pt and family.  Questions answered.  Verb understanding, agreeable to d/c home with outpt f/u.     Laray Anger, DO 09/22/13 1447

## 2013-09-19 NOTE — ED Notes (Signed)
Pt reports that she woke last Sunday morning, with severe left shoulder pain, has been told she has a pinched nerve.  Cont. To have pain to the area.  The only thing that has helped "was the shot she got on her last visit".  Has been taking her meds as prescribed, but severe pain has returned.

## 2013-09-19 NOTE — ED Notes (Signed)
Pt alert & oriented x4, stable gait. Patient  given discharge instructions, paperwork & prescription(s). Patient verbalized understanding. Pt left department w/ no further questions. 

## 2013-09-22 ENCOUNTER — Ambulatory Visit: Payer: BC Managed Care – PPO | Admitting: Orthopedic Surgery

## 2014-02-04 ENCOUNTER — Encounter: Payer: Self-pay | Admitting: Orthopedic Surgery

## 2014-02-04 ENCOUNTER — Ambulatory Visit (INDEPENDENT_AMBULATORY_CARE_PROVIDER_SITE_OTHER): Payer: BC Managed Care – PPO | Admitting: Orthopedic Surgery

## 2014-02-04 ENCOUNTER — Ambulatory Visit (INDEPENDENT_AMBULATORY_CARE_PROVIDER_SITE_OTHER): Payer: BC Managed Care – PPO

## 2014-02-04 VITALS — BP 118/77 | Ht 68.0 in | Wt 239.0 lb

## 2014-02-04 DIAGNOSIS — M25559 Pain in unspecified hip: Secondary | ICD-10-CM

## 2014-02-04 DIAGNOSIS — M549 Dorsalgia, unspecified: Secondary | ICD-10-CM

## 2014-02-04 DIAGNOSIS — M4716 Other spondylosis with myelopathy, lumbar region: Secondary | ICD-10-CM

## 2014-02-04 NOTE — Progress Notes (Signed)
Patient ID: Alyssa Romero, female   DOB: [birth year], 53 y.o.   MRN: [id number]  Chief Complaint  Patient presents with  . Hip Pain    Right hip pain. Consult from Dr. Ladona Ridgelaylor    This is a 53 year old female referred to us by Dr. Reatha HarpsMatthew Taylor for bilateral hip pain right greater than left with history of lumbar disc disease and radicular symptoms into the right leg for which he has treated. There is question of hip arthritis/bursitis. She complains of anterior hip pain with burning pain described as 8/10 worse when lying on her side and especially worse after sitting. She has difficulty walking after getting up from a seated position. She has pain when hiking, climbing. 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 No tenderness around the hip no pain with range of motion normal Lachman maneuvers. Full range of motion All joints are stable Motor exam is normal Skin clean dry and intact  Cardiovascular exam is normal Sensory exam normal  Imaging shows spondylosis of the lumbar spine including L1-L2 which may explain some of her hip  pain. Imaging of the pelvis shows normal hip joints bilaterally  Impression lumbar spondylosis  Recommend MRI  This patient has had back and hip pain for greater than 6 weeks and she's had treatment including ibuprofen and physical therapy and chiropractic manipulation she has failed to improve has persistent radicular pain in the right leg and will benefit from MRI to assess for surgical intervention versus epidural injection.

## 2014-02-04 NOTE — Patient Instructions (Signed)
MRI Ordered. 

## 2014-02-19 ENCOUNTER — Ambulatory Visit (HOSPITAL_COMMUNITY)
Admission: RE | Admit: 2014-02-19 | Discharge: 2014-02-19 | Disposition: A | Discharge status: Home / Self Care | Payer: BC Managed Care – PPO | Source: Ambulatory Visit | Attending: Orthopedic Surgery | Admitting: Orthopedic Surgery

## 2014-02-19 ENCOUNTER — Encounter (HOSPITAL_COMMUNITY): Payer: Self-pay

## 2014-02-19 DIAGNOSIS — M549 Dorsalgia, unspecified: Secondary | ICD-10-CM

## 2014-02-19 DIAGNOSIS — M51379 Other intervertebral disc degeneration, lumbosacral region without mention of lumbar back pain or lower extremity pain: Secondary | ICD-10-CM | POA: Insufficient documentation

## 2014-02-19 DIAGNOSIS — M5126 Other intervertebral disc displacement, lumbar region: Secondary | ICD-10-CM | POA: Insufficient documentation

## 2014-02-19 DIAGNOSIS — M5137 Other intervertebral disc degeneration, lumbosacral region: Secondary | ICD-10-CM | POA: Insufficient documentation

## 2014-02-19 DIAGNOSIS — M4716 Other spondylosis with myelopathy, lumbar region: Secondary | ICD-10-CM

## 2014-02-19 DIAGNOSIS — M25559 Pain in unspecified hip: Secondary | ICD-10-CM | POA: Insufficient documentation

## 2014-03-03 ENCOUNTER — Telehealth: Payer: Self-pay | Admitting: Orthopedic Surgery

## 2014-03-03 NOTE — Telephone Encounter (Signed)
Routing to Dr Harrison 

## 2014-05-05 ENCOUNTER — Emergency Department (HOSPITAL_COMMUNITY): Payer: BC Managed Care – PPO

## 2014-05-05 ENCOUNTER — Encounter (HOSPITAL_COMMUNITY): Payer: Self-pay | Admitting: Emergency Medicine

## 2014-05-05 ENCOUNTER — Emergency Department (HOSPITAL_COMMUNITY)
Admission: EM | Admit: 2014-05-05 | Discharge: 2014-05-05 | Disposition: A | Discharge status: Home / Self Care | Payer: BC Managed Care – PPO | Attending: Emergency Medicine | Admitting: Emergency Medicine

## 2014-05-05 DIAGNOSIS — Z8781 Personal history of (healed) traumatic fracture: Secondary | ICD-10-CM | POA: Insufficient documentation

## 2014-05-05 DIAGNOSIS — Y929 Unspecified place or not applicable: Secondary | ICD-10-CM | POA: Insufficient documentation

## 2014-05-05 DIAGNOSIS — S9000XA Contusion of unspecified ankle, initial encounter: Secondary | ICD-10-CM | POA: Insufficient documentation

## 2014-05-05 DIAGNOSIS — M19019 Primary osteoarthritis, unspecified shoulder: Secondary | ICD-10-CM | POA: Insufficient documentation

## 2014-05-05 DIAGNOSIS — Z79899 Other long term (current) drug therapy: Secondary | ICD-10-CM | POA: Insufficient documentation

## 2014-05-05 DIAGNOSIS — Y9301 Activity, walking, marching and hiking: Secondary | ICD-10-CM | POA: Insufficient documentation

## 2014-05-05 DIAGNOSIS — X500XXA Overexertion from strenuous movement or load, initial encounter: Secondary | ICD-10-CM | POA: Insufficient documentation

## 2014-05-05 DIAGNOSIS — S99911A Unspecified injury of right ankle, initial encounter: Secondary | ICD-10-CM

## 2014-05-05 DIAGNOSIS — R000 Tachycardia, unspecified: Secondary | ICD-10-CM | POA: Insufficient documentation

## 2014-05-05 DIAGNOSIS — Z8719 Personal history of other diseases of the digestive system: Secondary | ICD-10-CM | POA: Insufficient documentation

## 2014-05-05 MED ORDER — OXYCODONE-ACETAMINOPHEN 5-325 MG PO TABS: 1.0000 | ORAL_TABLET | ORAL | Status: DC | PRN

## 2014-05-05 NOTE — ED Provider Notes (Signed)
CSN: [id number]     Arrival date & time 05/05/14  2018 History   First MD Initiated Contact with Patient 05/05/14 2157     Chief Complaint  Patient presents with  . Ankle Injury     (Consider location/radiation/quality/duration/timing/severity/associated sxs/prior Treatment) Patient is a 53 y.o. female presenting with lower extremity injury. The history is provided by the patient.  Ankle Injury This is a new problem. The current episode started today. The problem occurs constantly. The problem has been gradually worsening.   Clide Deutschernna M Preble is a 53 y.o. female who presents to the ED with right ankle pain. She was walking and turned her ankle and felt a pop. The pain is severe. She states that last year she had a similar injury and had an avulsion fracture of the ankle. She was followed by Dr. Hilda LiasKeeling. She was afraid that she had done the same thing again. She denies any other injuries or problems tonight.   Past Medical History  Diagnosis Date  . Hiatal hernia   . GERD (gastroesophageal reflux disease)   . Pain in right shoulder   . Muscle spasm     "she has recurrent muscle spasms all over for as long as I've known her" per husband  . DDD (degenerative disc disease), cervical   . DJD of shoulder     left   Past Surgical History  Procedure Laterality Date  . Lymphectomy    . Shoulder surgery Right    No family history on file. History  Substance Use Topics  . Smoking status: Never Smoker   . Smokeless tobacco: Not on file  . Alcohol Use: Yes     Comment: occ   OB History   Grav Para Term Preterm Abortions TAB SAB Ect Mult Living                 Review of Systems Negative except as stated in HPI   Allergies  Decadron and Sulfonamide derivatives  Home Medications   Prior to Admission medications   Medication Sig Start Date End Date Taking? Authorizing Provider  cyclobenzaprine (FLEXERIL) 10 MG tablet Take 10 mg by mouth 3 (three) times daily as needed for muscle  spasms.    Historical Provider, MD  HYDROcodone-acetaminophen (NORCO) 5-325 MG per tablet Take 1 tablet by mouth every 4 (four) hours as needed. 09/17/13   Loren Raceravid Yelverton, MD  ibuprofen (ADVIL,MOTRIN) 600 MG tablet Take 1 tablet (600 mg total) by mouth every 6 (six) hours as needed. 09/17/13   Loren Raceravid Yelverton, MD  methocarbamol (ROBAXIN) 500 MG tablet Take 2 tablets (1,000 mg total) by mouth 4 (four) times daily as needed for muscle spasms (muscle spasm/pain). 09/19/13   Laray AngerKathleen M McManus, DO  ondansetron (ZOFRAN) 4 MG tablet Take 1 tablet (4 mg total) by mouth every 6 (six) hours. 09/17/13   Kristen N Ward, DO  ondansetron (ZOFRAN) 4 MG tablet Take 1 tablet (4 mg total) by mouth every 8 (eight) hours as needed for nausea or vomiting. 09/19/13   Laray AngerKathleen M McManus, DO  oxyCODONE-acetaminophen (PERCOCET/ROXICET) 5-325 MG per tablet 1 or 2 tabs PO q6h prn pain 09/19/13   Laray AngerKathleen M McManus, DO   BP 148/87  Pulse 107  Temp(Src) 99.4 F (37.4 C) (Oral)  Resp 20  Ht 5\' 9"  (1.753 m)  Wt 230 lb (104.327 kg)  BMI 33.95 kg/m2  SpO2 97% Physical Exam  Nursing note and vitals reviewed. Constitutional: She is oriented to person, place, and time. She  appears well-developed and well-nourished. No distress.  HENT:  Head: Normocephalic.  Eyes: EOM are normal.  Neck: Neck supple.  Cardiovascular: Tachycardia present.   Pulmonary/Chest: Effort normal.  Abdominal: Soft. There is no tenderness.  Musculoskeletal:       Right ankle: She exhibits swelling and ecchymosis. She exhibits no deformity, no laceration and normal pulse. Decreased range of motion: du to pain. Tenderness. Lateral malleolus tenderness found. Achilles tendon normal.  Pedal pulses equal, adequate circulation, good touch sensation.   Neurological: She is alert and oriented to person, place, and time. No cranial nerve deficit.  Skin: Skin is warm and dry.  Psychiatric: She has a normal mood and affect. Her behavior is normal.    ED  Course  Procedures (including critical care time) Labs Review Labs Reviewed - No data to display  Imaging Review Dg Ankle Complete Right  05/05/2014   CLINICAL DATA:  Ankle injury. The patient fell and heard a pop in her right ankle. Lateral pain and swelling.  EXAM: RIGHT ANKLE - COMPLETE 3+ VIEW  COMPARISON:  Right ankle radiographs 11/30/2012  FINDINGS: Soft tissue swelling is present over the lateral malleolus. Ossification distal to the lateral malleolus is related to the previous trauma. No acute osseous abnormality is present. The ankle joint is located. A joint effusion is present. Extensive soft tissue swelling is noted over the lateral malleolus.  IMPRESSION: 1. Soft tissue swelling over the lateral malleolus and ankle joint effusion without an acute osseous abnormality. Ligamentous injury is not excluded.   Electronically Signed   By: Gennette Pac M.D.   On: 05/05/2014 21:24     MDM  53 y.o. female with right ankle pain and swelling s/p injury prior to arrival to the ED. Stable for discharge without neurovascular deficits. She has a boot that she had with the last ankle injury that she will wear. She does not like to take pain medication, however, I told her we will give her a prepack in the event she gets home and has difficulty sleeping due to pain. She will follow up with Dr. Hilda Lias. She will return here as needed. Ice applied to the area, she is to rest and elevate the area. Discussed with the patient and all questioned fully answered. She will return if any problems arise.    Medication List    ASK your doctor about these medications       cyclobenzaprine 10 MG tablet  Commonly known as:  FLEXERIL  Take 10 mg by mouth 3 (three) times daily as needed for muscle spasms.     HYDROcodone-acetaminophen 5-325 MG per tablet  Commonly known as:  NORCO  Take 1 tablet by mouth every 4 (four) hours as needed.     ibuprofen 600 MG tablet  Commonly known as:  ADVIL,MOTRIN  Take 1  tablet (600 mg total) by mouth every 6 (six) hours as needed.     methocarbamol 500 MG tablet  Commonly known as:  ROBAXIN  Take 2 tablets (1,000 mg total) by mouth 4 (four) times daily as needed for muscle spasms (muscle spasm/pain).     ondansetron 4 MG tablet  Commonly known as:  ZOFRAN  Take 1 tablet (4 mg total) by mouth every 6 (six) hours.     ondansetron 4 MG tablet  Commonly known as:  ZOFRAN  Take 1 tablet (4 mg total) by mouth every 8 (eight) hours as needed for nausea or vomiting.     oxyCODONE-acetaminophen 5-325 MG per tablet  Commonly known as:  PERCOCET/ROXICET  1 or 2 tabs PO q6h prn pain  Ask about: Which instructions should I use?     oxyCODONE-acetaminophen 5-325 MG per tablet  Commonly known as:  PERCOCET/ROXICET  Take 1 tablet by mouth every 4 (four) hours as needed for moderate pain or severe pain.  Ask about: Which instructions should I use?          [facility] Orlene Och, [place] 05/06/14 1505

## 2014-05-05 NOTE — ED Notes (Signed)
Patient states she fell and felt a pop in her right ankle.

## 2014-05-05 NOTE — Discharge Instructions (Signed)
Wear your boot that you had when you injured your ankle before. Call Dr. Hilda LiasKeeling tomorrow for follow up. Return here as needed.

## 2014-05-06 NOTE — ED Provider Notes (Signed)
Medical screening examination/treatment/procedure(s) were performed by non-physician practitioner and as supervising physician I was immediately available for consultation/collaboration.   EKG Interpretation None       Carvell Hoeffner, MD 05/06/14 1910 

## 2014-05-10 MED FILL — Oxycodone w/ Acetaminophen Tab 5-325 MG: ORAL | Qty: 6 | Status: AC

## 2014-08-18 ENCOUNTER — Encounter: Payer: Self-pay | Admitting: Gastroenterology

## 2014-09-20 ENCOUNTER — Ambulatory Visit: Payer: BC Managed Care – PPO | Admitting: Gastroenterology

## 2014-09-27 ENCOUNTER — Ambulatory Visit (INDEPENDENT_AMBULATORY_CARE_PROVIDER_SITE_OTHER): Payer: BC Managed Care – PPO | Admitting: Gastroenterology

## 2014-09-27 ENCOUNTER — Encounter: Payer: Self-pay | Admitting: Gastroenterology

## 2014-09-27 VITALS — BP 138/85 | HR 81 | Temp 97.6°F | Ht 69.0 in | Wt 254.2 lb

## 2014-09-27 DIAGNOSIS — R1310 Dysphagia, unspecified: Secondary | ICD-10-CM

## 2014-09-27 DIAGNOSIS — R1319 Other dysphagia: Secondary | ICD-10-CM | POA: Insufficient documentation

## 2014-09-27 DIAGNOSIS — R1314 Dysphagia, pharyngoesophageal phase: Secondary | ICD-10-CM

## 2014-09-27 DIAGNOSIS — K449 Diaphragmatic hernia without obstruction or gangrene: Secondary | ICD-10-CM | POA: Insufficient documentation

## 2014-09-27 DIAGNOSIS — R112 Nausea with vomiting, unspecified: Secondary | ICD-10-CM | POA: Insufficient documentation

## 2014-09-27 NOTE — Progress Notes (Signed)
cc'ed to pcp °

## 2014-09-27 NOTE — Patient Instructions (Signed)
1. Barium study of your esophagus and stomach as ordered.  2. Consider a colonoscopy for colon cancer screening. Let us know if you decide to have this done at some point in the future.

## 2014-09-27 NOTE — Assessment & Plan Note (Addendum)
53 year old lady with history of large hiatal hernia presents with progressive one-year history of postprandial epigastric pain/cramping associated with vomiting as well as solid food dysphagia. Suspect symptoms related to large hiatal hernia plus or minus esophageal stricture. Gallbladder remains in situ. Discussed at length with patient today. Consider endoscopy versus barium study. Barium study will allow us to view the anatomy of her hernia, exclude esophageal component, identifying esophageal stricture. She may still require an endoscopy. She prefers less invasive study initially. She states that she does not want to go back on a PPI after finally coming off of it a couple years ago.  Patient has not had a colonoscopy in the past. Discussed considering colon cancer screening at some point in the near future via colonoscopy. She will let us know when she is ready.

## 2014-09-27 NOTE — Progress Notes (Signed)
Primary Care Physician:  Mia CreekVAUGHAN,ELIZABETH, MD  Primary Gastroenterologist:  Jonette EvaSandi Fields, MD   Chief Complaint  Patient presents with  . Dysphagia    HPI:  Alyssa Romero is a 53 y.o. female here for further evaluation of hiatal hernia. She was last seen 2011. She has a history of large hiatal hernia, gastritis, possible subtle proximal web status post dilation. She states overall she had been doing well up until about one year ago. She started having occasional postprandial epigastric pain/cramping associated with vomiting and subsequent relief of her symptoms. Also noted some dysphagia to solid foods. Vomiting and epigastric pain occur with any type foods and sometimes liquids. Over the past several months symptoms have been progressive and occurring daily. Notes that she came off of PPI about 2 years ago at the recommendation of Dr. Alessandra BevelsVaughn. Rarely has heartburn. Usually if he eats too late at night or with certain foods. Takes an occasional Rolaids or Tums with resolution of symptoms. Bowel movements are regular. No blood in the stool or melena. No unintentional weight loss.  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 She is concerned that she may need to have her hiatal hernia fixed. If she requires another endoscopy she would like to have deep sedation stating that she was inadequately sedated previously.  No prior colonoscopy.    No current outpatient prescriptions on file.   No current facility-administered medications for this visit.    Allergies as of 09/27/2014 - Review Complete 09/27/2014  Allergen Reaction Noted  . Decadron [dexamethasone]  11/30/2012  . Sulfonamide derivatives      Past Medical History  Diagnosis Date  . Hiatal hernia   . GERD (gastroesophageal reflux disease)   . Pain in right shoulder   . Muscle spasm     "she has recurrent muscle spasms all over for as long as I've known her" per husband  . DDD (degenerative  disc disease), cervical   . DJD of shoulder     left    Past Surgical History  Procedure Laterality Date  . Lymphectomy      age 53  . Shoulder surgery Right   . Esophagogastroduodenoscopy  09/27/10    ZHY:QMVHQISLF:normal without barrett mass or structures/large HH/she was dilated to 16 mm. gastritis on path.  . Fertility surgery      Family History  Problem Relation Age of Onset  . Adopted: Yes    History   Social History  . Marital Status: Legally Separated    Spouse Name: N/A    Number of Children: N/A  . Years of Education: N/A   Occupational History  . teacher-science    Social History Main Topics  . Smoking status: Former Games developermoker  . Smokeless tobacco: Not on file     Comment: remotely  . Alcohol Use: 0.0 oz/week    0 Not specified per week     Comment: occ  . Drug Use: No  . Sexual Activity: Yes    Birth Control/ Protection: Post-menopausal   Other Topics Concern  . Not on file   Social History Narrative      ROS:  General: Negative for anorexia, weight loss, fever, chills, fatigue, weakness. Eyes: Negative for vision changes.  ENT: Negative for hoarseness, difficulty swallowing , nasal congestion. CV: Negative for chest pain, angina, palpitations, dyspnea on exertion, peripheral edema.  Respiratory: Negative for dyspnea at rest, dyspnea on exertion, cough, sputum, wheezing.  GI: See history  of present illness. GU:  Negative for dysuria, hematuria, urinary incontinence, urinary frequency, nocturnal urination.  MS: Negative for joint pain, low back pain.  Derm: Negative for rash or itching.  Neuro: Negative for weakness, abnormal sensation, seizure, frequent headaches, memory loss, confusion.  Psych: Negative for anxiety, depression, suicidal ideation, hallucinations.  Endo: Negative for unusual weight change.  Heme: Negative for bruising or bleeding. Allergy: Negative for rash or hives.    Physical Examination:  BP 138/85 mmHg  Pulse 81  Temp(Src)  97.6 F (36.4 C) (Oral)  Ht 5\' 9"  (1.753 m)  Wt 254 lb 3.2 oz (115.304 kg)  BMI 37.52 kg/m2   General: Well-nourished, well-developed in no acute distress.  Head: Normocephalic, atraumatic.   Eyes: Conjunctiva pink, no icterus. Mouth: Oropharyngeal mucosa moist and pink , no lesions erythema or exudate. Neck: Supple without thyromegaly, masses, or lymphadenopathy.  Lungs: Clear to auscultation bilaterally.  Heart: Regular rate and rhythm, no murmurs rubs or gallops.  Abdomen: Bowel sounds are normal, nontender, nondistended, no hepatosplenomegaly or masses, no abdominal bruits or    hernia , no rebound or guarding.   Rectal: not performed Extremities: No lower extremity edema. No clubbing or deformities.  Neuro: Alert and oriented x 4 , grossly normal neurologically.  Skin: Warm and dry, no rash or jaundice.   Psych: Alert and cooperative, normal mood and affect.

## 2014-10-07 ENCOUNTER — Ambulatory Visit (HOSPITAL_COMMUNITY)
Admission: RE | Admit: 2014-10-07 | Discharge: 2014-10-07 | Disposition: A | Discharge status: Home / Self Care | Payer: BC Managed Care – PPO | Source: Ambulatory Visit | Attending: Gastroenterology | Admitting: Gastroenterology

## 2014-10-07 DIAGNOSIS — R1319 Other dysphagia: Secondary | ICD-10-CM | POA: Diagnosis not present

## 2014-10-07 DIAGNOSIS — R079 Chest pain, unspecified: Secondary | ICD-10-CM | POA: Insufficient documentation

## 2014-10-07 DIAGNOSIS — R1310 Dysphagia, unspecified: Secondary | ICD-10-CM

## 2014-10-07 DIAGNOSIS — R112 Nausea with vomiting, unspecified: Secondary | ICD-10-CM

## 2014-10-07 DIAGNOSIS — K449 Diaphragmatic hernia without obstruction or gangrene: Secondary | ICD-10-CM

## 2014-10-07 DIAGNOSIS — R109 Unspecified abdominal pain: Secondary | ICD-10-CM | POA: Insufficient documentation

## 2014-10-07 NOTE — Procedures (Deleted)
Preprocedure Dx:   Multiple sclerosis, dysarthria  Postprocedure Dx: Multiple sclerosis, dysarthria  Procedure: Fluoroscopically guided lumbar puncture  Radiologist: Batina Dougan  Anesthesia: 2 ml of 1% lidocaine  Specimen: ~10 ml CSF, Clear Colorless  EBL: None  Opening pressure: 2 cm H2O  Complications: None  

## 2014-10-18 NOTE — Progress Notes (Signed)
Quick Note:  Large hiatal hernia but no paraesophageal component and no esophageal stricture. To discuss with Dr. Darrick PennaFields. ______

## 2014-10-29 NOTE — Progress Notes (Signed)
Quick Note:  Dr. Darrick PennaFields, Do you think she needs EGD to evaluate duodenitis and hh prior to considering surgery referral? ______

## 2014-11-02 NOTE — Progress Notes (Signed)
REVIEWED. EGD/POSSIBLE ESOPHAGEAL DILATION W/ MAC DUE TO DYSPHAGIA/DYSPEPESIA.

## 2014-11-03 NOTE — Progress Notes (Signed)
Quick Note:  Please let patient know her results. Dr. Darrick PennaFields recommends EGD/POSSIBLE ESOPHAGEAL DILATION W/ MAC DUE TO DYSPHAGIA/DYSPEPESIA.  Patient had started a new job so I'm not sure if she will proceed with this soon. Let me know what she decides. ______

## 2014-11-03 NOTE — Progress Notes (Signed)
Quick Note:  LMOM ( both numbers) to call. ______ 

## 2014-11-09 NOTE — Progress Notes (Signed)
Quick Note:  Returned pt's Vm and LMOM for a return call. ______

## 2014-11-10 NOTE — Progress Notes (Signed)
Quick Note:  Pt is aware. She said she will have to check her schedule and it might be spring break before she can do the EGD. She will let us know. ______

## 2017-05-21 ENCOUNTER — Ambulatory Visit (INDEPENDENT_AMBULATORY_CARE_PROVIDER_SITE_OTHER): Payer: BC Managed Care – PPO | Admitting: Orthopaedic Surgery

## 2017-05-21 ENCOUNTER — Encounter: Payer: Self-pay | Admitting: Orthopaedic Surgery

## 2017-05-21 VITALS — BP 120/78 | HR 72 | Temp 97.7°F | Ht 67.0 in

## 2017-05-21 DIAGNOSIS — M25562 Pain in left knee: Secondary | ICD-10-CM

## 2017-05-21 NOTE — Progress Notes (Signed)
Subjective:    Patient ID: Alyssa Romero, female    DOB: 1961-01-13, 56 y.o.   MRN: [id number]  HPI On May 18, 2017 (two days ago), she was pushing her washing machine back against the wall.  She twisted and fell hurting her left knee.  She felt severe pain and heard a loud pop.  She could not stand on the knee.  [facility].  X-rays were done.  They were read as negative.  She could not fully extend the knee. She was given a knee immobilizer and told to come here today.  She has no other injury.  She has used ice and ibuprofen and the knee immobilizer and crutches.  She still cannot fully extend the knee and has swelling and medial pain.   Review of Systems  HENT: Negative for congestion.   Respiratory: Negative for cough and shortness of breath.   Cardiovascular: Negative for leg swelling.  Endocrine: Negative for cold intolerance.  Musculoskeletal: Positive for arthralgias, gait problem and joint swelling.  Allergic/Immunologic: Negative for environmental allergies.   Past Medical History:  Diagnosis Date  . DDD (degenerative disc disease), cervical   . DJD of shoulder    left  . GERD (gastroesophageal reflux disease)   . Hiatal hernia   . Muscle spasm    "she has recurrent muscle spasms all over for as long as I've known her" per husband  . Pain in right shoulder     Past Surgical History:  Procedure Laterality Date  . ESOPHAGOGASTRODUODENOSCOPY  09/27/10   WUJ:WJXBJYSLF:normal without barrett mass or structures/large HH/she was dilated to 16 mm. gastritis on path.  . FERTILITY SURGERY    . lymphectomy     age 56  . SHOULDER SURGERY Right     No current outpatient prescriptions on file prior to visit.   No current facility-administered medications on file prior to visit.     Social History   Social History  . Marital status: Legally Separated    Spouse name: N/A  . Number of children: N/A  . Years of education: N/A   Occupational History  .  teacher-science    Social History Main Topics  . Smoking status: Former Games developermoker  . Smokeless tobacco: Never Used     Comment: remotely  . Alcohol use 0.0 oz/week     Comment: occ  . Drug use: No  . Sexual activity: Yes    Birth control/ protection: Post-menopausal   Other Topics Concern  . Not on file   Social History Narrative  . No narrative on file    Family History  Problem Relation Age of Onset  . Adopted: Yes    BP 120/78   Pulse 72   Temp 97.7 F (36.5 C)   Ht 5\' 7"  (1.702 m) Comment: per patient in wheelchair and unable to obtain     Objective:   Physical Exam  Constitutional: She is oriented to person, place, and time. She appears well-developed and well-nourished.  HENT:  Head: Normocephalic and atraumatic.  Eyes: Pupils are equal, round, and reactive to light. Conjunctivae and EOM are normal.  Neck: Normal range of motion. Neck supple.  Cardiovascular: Normal rate, regular rhythm and intact distal pulses.   Pulmonary/Chest: Effort normal.  Abdominal: Soft.  Musculoskeletal: She exhibits tenderness (Left knee with effusion, very tender especially medially.  She cannot fully extend the knee lacking 5 to 7 degrees, flexion only to 50 with pain, she has medial  laxity and weakly positive Lachman.  NV intact.  No distal edema.  Right knee negative.).  Neurological: She is alert and oriented to person, place, and time. She displays normal reflexes. No cranial nerve deficit. She exhibits normal muscle tone. Coordination normal.  Skin: Skin is warm and dry.  Psychiatric: She has a normal mood and affect. Her behavior is normal. Judgment and thought content normal.  Vitals reviewed.         Assessment & Plan:   Encounter Diagnosis  Name Primary?  . Acute pain of left knee Yes   I am concerned about an ACL tear, medial collateral ligament tear and medial meniscus tear (the terrible triad).  She has laxity, inability to fully extend, effusion.  I am ordering  a MRI of the left knee.  She is to continue the knee immobilizer, ice, and ibuprofen.  Return after the MRI.  Call if any problem.  Precautions discussed.   Electronically Signed Darreld McleanWayne Welcome Fults, MD 7/31/20189:44 AM

## 2017-05-24 ENCOUNTER — Ambulatory Visit (HOSPITAL_COMMUNITY)
Admission: RE | Admit: 2017-05-24 | Discharge: 2017-05-24 | Disposition: A | Discharge status: Home / Self Care | Payer: BC Managed Care – PPO | Source: Ambulatory Visit | Attending: Orthopaedic Surgery | Admitting: Orthopaedic Surgery

## 2017-05-24 DIAGNOSIS — M7122 Synovial cyst of popliteal space [Baker], left knee: Secondary | ICD-10-CM | POA: Diagnosis not present

## 2017-05-24 DIAGNOSIS — M25562 Pain in left knee: Secondary | ICD-10-CM

## 2017-05-24 DIAGNOSIS — X58XXXA Exposure to other specified factors, initial encounter: Secondary | ICD-10-CM | POA: Diagnosis not present

## 2017-05-24 DIAGNOSIS — S83512A Sprain of anterior cruciate ligament of left knee, initial encounter: Secondary | ICD-10-CM | POA: Diagnosis not present

## 2017-05-24 DIAGNOSIS — M25462 Effusion, left knee: Secondary | ICD-10-CM | POA: Diagnosis not present

## 2017-05-29 ENCOUNTER — Encounter: Payer: Self-pay | Admitting: Orthopaedic Surgery

## 2017-05-29 ENCOUNTER — Ambulatory Visit (INDEPENDENT_AMBULATORY_CARE_PROVIDER_SITE_OTHER): Payer: BC Managed Care – PPO | Admitting: Orthopaedic Surgery

## 2017-05-29 DIAGNOSIS — M25562 Pain in left knee: Secondary | ICD-10-CM | POA: Diagnosis not present

## 2017-05-29 DIAGNOSIS — S83512A Sprain of anterior cruciate ligament of left knee, initial encounter: Secondary | ICD-10-CM | POA: Diagnosis not present

## 2017-05-29 NOTE — Progress Notes (Signed)
Patient UJ:WJXB Alyssa Romero, female DOB:1961-05-05, 56 y.o. JYN:[id number]  No chief complaint on file.   HPI  Alyssa Romero is a 56 y.o. female who has had acute pain of the left knee after a washing machine fall.  She had MRI done which showed: IMPRESSION: Complete ACL tear from its femoral attachment with associated small bone contusion in the lateral tibial plateau and a small joint effusion.  Small horizontal tear central posterior horn lateral meniscus.  Small Baker's cyst.  I have explained the findings to her.  She is in a knee immobilizer.  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  She will need surgery.  I will have Dr. Romeo Apple see her.  She is agreeable.  Continue the knee immobilizer.  Handicap space form given. HPI  There is no height or weight on file to calculate BMI.  ROS  Review of Systems  HENT: Negative for congestion.   Respiratory: Negative for cough and shortness of breath.   Cardiovascular: Negative for leg swelling.  Endocrine: Negative for cold intolerance.  Musculoskeletal: Positive for arthralgias, gait problem and joint swelling.  Allergic/Immunologic: Negative for environmental allergies.    Past Medical History:  Diagnosis Date  . DDD (degenerative disc disease), cervical   . DJD of shoulder    left  . GERD (gastroesophageal reflux disease)   . Hiatal hernia   . Muscle spasm    "she has recurrent muscle spasms all over for as long as I've known her" per husband  . Pain in right shoulder     Past Surgical History:  Procedure Laterality Date  . ESOPHAGOGASTRODUODENOSCOPY  09/27/10   QMV:HQIONG without barrett mass or structures/large HH/she was dilated to 16 mm. gastritis on path.  . FERTILITY SURGERY    . lymphectomy     age 68  . SHOULDER SURGERY Right     Family History  Problem Relation Age of Onset  . Adopted: Yes    Social History Social History  Substance Use Topics  .  Smoking status: Former Games developer  . Smokeless tobacco: Never Used     Comment: remotely  . Alcohol use 0.0 oz/week     Comment: occ    Allergies  Allergen Reactions  . Statins Hives  . Decadron [Dexamethasone]     Decrease in blood pressure  . Sulfonamide Derivatives     Reaction unknown    Current Outpatient Prescriptions  Medication Sig Dispense Refill  . ibuprofen (ADVIL,MOTRIN) 200 MG tablet Take 200 mg by mouth every 6 (six) hours as needed.     No current facility-administered medications for this visit.      Physical Exam  There were no vitals taken for this visit.  Constitutional: overall normal hygiene, normal nutrition, well developed, normal grooming, normal body habitus. Assistive device:left knee immobilizer  Musculoskeletal: gait and station Limp left, muscle tone and strength are normal, no tremors or atrophy is present.  .  Neurological: coordination overall normal.  Deep tendon reflex/nerve stretch intact.  Sensation normal.  Cranial nerves II-XII intact.   Skin:   Normal overall no scars, lesions, ulcers or rashes. No psoriasis.  Psychiatric: Alert and oriented x 3.  Recent memory intact, remote memory unclear.  Normal mood and affect. Well groomed.  Good eye contact.  Cardiovascular: overall no swelling, no varicosities, no edema bilaterally, normal temperatures of the legs and arms, no clubbing, cyanosis and good capillary refill.  Lymphatic: palpation is normal.  Left  knee has effusion, lateral joint line pain, positive drawer and Lachman, NV intact.  No distal edema is present.  The patient has been educated about the nature of the problem(s) and counseled on treatment options.  The patient appeared to understand what I have discussed and is in agreement with it.  Encounter Diagnoses  Name Primary?  . Acute pain of left knee Yes  . Anterior cruciate ligament complete tear, left, initial encounter     PLAN Call if any problems.  Precautions  discussed.  Continue current medications.   Return to clinic To see Dr. Romeo AppleHarrison for surgery   Electronically Signed Darreld McleanWayne Laythan Hayter, MD 8/8/20188:20 PM

## 2017-06-03 ENCOUNTER — Telehealth: Payer: Self-pay | Admitting: Orthopedic Surgery

## 2017-06-03 ENCOUNTER — Ambulatory Visit (INDEPENDENT_AMBULATORY_CARE_PROVIDER_SITE_OTHER): Payer: BC Managed Care – PPO | Admitting: Orthopedic Surgery

## 2017-06-03 ENCOUNTER — Encounter: Payer: Self-pay | Admitting: Orthopedic Surgery

## 2017-06-03 DIAGNOSIS — M23301 Other meniscus derangements, unspecified lateral meniscus, left knee: Secondary | ICD-10-CM

## 2017-06-03 DIAGNOSIS — S83512A Sprain of anterior cruciate ligament of left knee, initial encounter: Secondary | ICD-10-CM | POA: Diagnosis not present

## 2017-06-03 NOTE — Telephone Encounter (Signed)
Call to insurer regarding pre-authorization for date of surgery planned 06/06/17, CPT 29881 arthroscopy w/lateral menisc. Per representative [place], no pre-authorization required for out-patient procedure. Her name and today's date for reference, 06/03/17, 4:22p.m.

## 2017-06-03 NOTE — Addendum Note (Signed)
Addended by: Vickki HearingHARRISON, STANLEY E on: 06/03/2017 04:37 PM   Modules accepted: Orders, SmartSet

## 2017-06-03 NOTE — Progress Notes (Addendum)
NEW PATIENT OFFICE VISIT    Chief Complaint  Patient presents with  . Knee Pain    surgical consult left knee ACL tear     56 year old female injured her left knee pushing a lawn washing machine. She tore the anterior cruciate ligament and lateral meniscus. She presents for evaluation and treatment  PAIN LOCATION POSTERIOR ASPECT OF THE KNEE  DOI July 28TH  PAIN IS DULL PAIN IS MODERATE  SHE SAYS SHE CAN WALK STRAIGHT FINE, BUT IF SHE TRIES TO TURN THEN SHE FEELS PAIN      Review of Systems  Musculoskeletal: Positive for joint pain.  All other systems reviewed and are negative.  Past Medical History:  Diagnosis Date  . DDD (degenerative disc disease), cervical   . DJD of shoulder    left  . GERD (gastroesophageal reflux disease)   . Hiatal hernia   . Muscle spasm    "she has recurrent muscle spasms all over for as long as I've known her" per husband  . Pain in right shoulder     Past Surgical History:  Procedure Laterality Date  . ESOPHAGOGASTRODUODENOSCOPY  09/27/10   FAO:ZHYQMV without barrett mass or structures/large HH/she was dilated to 16 mm. gastritis on path.  . FERTILITY SURGERY    . lymphectomy     age 89  . SHOULDER SURGERY Right     Family History  Problem Relation Age of Onset  . Adopted: Yes   Social History  Substance Use Topics  . Smoking status: Former Games developer  . Smokeless tobacco: Never Used     Comment: remotely  . Alcohol use 0.0 oz/week     Comment: occ    There were no vitals taken for this visit.  Physical Exam  Constitutional: She is oriented to person, place, and time. She appears well-developed and well-nourished.  Musculoskeletal:       Right knee: Normal.  Gait: limp; knee brace   Neurological: She is alert and oriented to person, place, and time.  Psychiatric: She has a normal mood and affect.  Vitals reviewed.   Left Knee Exam  Swelling: Mild Effusion: Yes  Tenderness  The patient is experiencing tenderness in  the posterior .  Range of Motion  Extension: 0 Flexion:     100  Muscle Strength  Normal left knee strength  Tests  McMurrays:  Lateral - n/t Lachman:  Anterior - Positive    Posterior - Negative Drawer:       Anterior - Positive      Varus:  Negative Valgus: Negative  Comments:  Neurovascular exam is normal   Right Knee Exam  Right knee exam is normal.  Tenderness  None  Range of Motion  Normal right knee ROM  Muscle Strength  Normal right knee strength  Tests  Drawer:       Anterior - Negative        MRI 05/2017 IMPRESSION: Complete ACL tear from its femoral attachment with associated small bone contusion in the lateral tibial plateau and a small joint effusion.   Small horizontal tear central posterior horn lateral meniscus.   Small Baker's cyst.     Electronically Signed   By: Drusilla Kanner M.D.   On: 05/24/2017 14:42   Encounter Diagnoses  Name Primary?  . Derangement of lateral meniscus of left knee Yes  . Anterior cruciate ligament complete tear, left, initial encounter      PLAN:  She has as I told her choice of acl  reconstruction or arthroscopy only. She has no routine activity involving cutting pivoting. She does hike occasionally  My advice is left knee arthroscopy lateral menisectomy brace and rehab If she has disabling instability later then acl reconstruction can be done   OOW 2 WEEKS   SURGERY THURS   This procedure has been fully reviewed with the patient and written informed consent has been obtained. RISKS PERTAINING TO THIS SURGERY S DISCUSSED WITH HER  BLEEDING INFECTION PAIN INSTABILITY DVT PE

## 2017-06-04 NOTE — Patient Instructions (Signed)
Alyssa Romero  06/04/2017     @PREFPERIOPPHARMACY @   Your procedure is scheduled on 06/06/2017.  [facility] at 8:00 A.M.  Call this number if you have problems the morning of surgery:  [phone]   Remember:  Do not eat food or drink liquids after midnight.  Take these medicines the morning of surgery with A SIP OF WATER None   Do not wear jewelry, make-up or nail polish.  Do not wear lotions, powders, or perfumes, or deoderant.  Do not shave 48 hours prior to surgery.  Men may shave face and neck.  Do not bring valuables to the hospital.  [facility] is not responsible for any belongings or valuables.  Contacts, dentures or bridgework may not be worn into surgery.  Leave your suitcase in the car.  After surgery it may be brought to your room.  For patients admitted to the hospital, discharge time will be determined by your treatment team.  Patients discharged the day of surgery will not be allowed to drive home.    Please read over the following fact sheets that you were given. Surgical Site Infection Prevention and Anesthesia Post-op Instructions     PATIENT INSTRUCTIONS POST-ANESTHESIA  IMMEDIATELY FOLLOWING SURGERY:  Do not drive or operate machinery for the first twenty four hours after surgery.  Do not make any important decisions for twenty four hours after surgery or while taking narcotic pain medications or sedatives.  If you develop intractable nausea and vomiting or a severe headache please notify your doctor immediately.  FOLLOW-UP:  Please make an appointment with your surgeon as instructed. You do not need to follow up with anesthesia unless specifically instructed to do so.  WOUND CARE INSTRUCTIONS (if applicable):  Keep a dry clean dressing on the anesthesia/puncture wound site if there is drainage.  Once the wound has quit draining you may leave it open to air.  Generally you should leave the bandage intact for twenty four hours unless there  is drainage.  If the epidural site drains for more than 36-48 hours please call the anesthesia department.  QUESTIONS?:  Please feel free to call your physician or the hospital operator if you have any questions, and they will be happy to assist you.      Knee Arthroscopy Knee arthroscopy is a surgical procedure that is used to examine the inside of your knee joint and repair any damage. The surgeon puts a small, lighted instrument with a camera on the tip (arthroscope) through a small incision in your knee. The camera sends pictures to a monitor in the operating room. Your surgeon uses those pictures to guide the surgical instruments through other incisions to the area of damage. Knee arthroscopy can be used to treat many types of knee problems. It may be used:  To repair a torn ligament.  To repair or remove damaged tissue.  To remove a fluid-filled sac (cyst) from your knee.  Tell a health care provider about:  Any allergies you have.  All medicines you are taking, including vitamins, herbs, eye drops, creams, and over-the-counter medicines.  Any problems you or family members have had with anesthetic medicines.  Any blood disorders you have.  Any surgeries you have had.  Any medical conditions you have. What are the risks? Generally, this is a safe procedure. However, problems may occur, including:  Infection.  Bleeding.  Damage to blood vessels, nerves, or structures of your knee.  A blood clot that forms  in your leg and travels to your lung.  Failure to relieve symptoms.  What happens before the procedure?  Ask your health care provider about: ? Changing or stopping your regular medicines. This is especially important if you are taking diabetes medicines or blood thinners. ? Taking medicines such as aspirin and ibuprofen. These medicines can thin your blood. Do not take these medicines before your procedure if your health care provider instructs you not  to.  Follow your health care provider's instructions about eating or drinking restrictions.  Plan to have someone take you home after the procedure.  If you go home right after the procedure, plan to have someone with you for 24 hours.  Do not drink alcohol unless your health care provider says that you can.  Do not use any tobacco products, including cigarettes, chewing tobacco, or electronic cigarettes unless your health care provider says that you can. If you need help quitting, ask your health care provider.  You may have a physical exam. What happens during the procedure?  An IV tube will be inserted into one of your veins.  You will be given one or more of the following: ? A medicine that helps you relax (sedative). ? A medicine that numbs the area (local anesthetic). ? A medicine that makes you fall asleep (general anesthetic). ? A medicine that is injected into your spine that numbs the area below and slightly above the injection site (spinal anesthetic). ? A medicine that is injected into an area of your body that numbs everything below the injection site (regional anesthetic).  A cuff may be placed around your upper leg to slow bleeding during the procedure.  The surgeon will make a small number of incisions around your knee.  Your knee joint will be flushed and filled with a germ-free (sterile) solution.  The arthroscope will be passed through an incision into your knee joint.  More instruments will be passed through other incisions to repair your knee as needed.  The fluid will be removed from your knee.  The incisions will be closed with adhesive strips or stitches (sutures).  A bandage (dressing) will be placed over your knee. The procedure may vary among health care providers and hospitals. What happens after the procedure?  Your blood pressure, heart rate, breathing rate and blood oxygen level will be monitored often until the medicines you were given have  worn off.  You may be given medicine for pain.  You may get crutches to help you walk without using your knee to support your body weight.  You may have to wear compression stockings. These stocking help to prevent blood clots and reduce swelling in your legs. This information is not intended to replace advice given to you by your health care provider. Make sure you discuss any questions you have with your health care provider. Document Released: 10/05/2000 Document Revised: 03/15/2016 Document Reviewed: 10/04/2014 Elsevier Interactive Patient Education  2017 [employer].

## 2017-06-05 ENCOUNTER — Encounter (HOSPITAL_COMMUNITY)
Admission: RE | Admit: 2017-06-05 | Discharge: 2017-06-05 | Disposition: A | Discharge status: Home / Self Care | Payer: BC Managed Care – PPO | Source: Ambulatory Visit | Attending: Orthopedic Surgery | Admitting: Orthopedic Surgery

## 2017-06-05 ENCOUNTER — Encounter (HOSPITAL_COMMUNITY): Payer: Self-pay

## 2017-06-05 DIAGNOSIS — D649 Anemia, unspecified: Secondary | ICD-10-CM | POA: Diagnosis not present

## 2017-06-05 DIAGNOSIS — S83289A Other tear of lateral meniscus, current injury, unspecified knee, initial encounter: Secondary | ICD-10-CM | POA: Diagnosis present

## 2017-06-05 DIAGNOSIS — M1990 Unspecified osteoarthritis, unspecified site: Secondary | ICD-10-CM | POA: Diagnosis not present

## 2017-06-05 DIAGNOSIS — M19012 Primary osteoarthritis, left shoulder: Secondary | ICD-10-CM | POA: Diagnosis not present

## 2017-06-05 DIAGNOSIS — S83512A Sprain of anterior cruciate ligament of left knee, initial encounter: Secondary | ICD-10-CM | POA: Diagnosis not present

## 2017-06-05 DIAGNOSIS — X509XXA Other and unspecified overexertion or strenuous movements or postures, initial encounter: Secondary | ICD-10-CM | POA: Diagnosis not present

## 2017-06-05 DIAGNOSIS — Z87891 Personal history of nicotine dependence: Secondary | ICD-10-CM | POA: Diagnosis not present

## 2017-06-05 DIAGNOSIS — K449 Diaphragmatic hernia without obstruction or gangrene: Secondary | ICD-10-CM | POA: Diagnosis not present

## 2017-06-05 DIAGNOSIS — M94262 Chondromalacia, left knee: Secondary | ICD-10-CM | POA: Diagnosis not present

## 2017-06-05 DIAGNOSIS — K219 Gastro-esophageal reflux disease without esophagitis: Secondary | ICD-10-CM | POA: Diagnosis not present

## 2017-06-05 DIAGNOSIS — S83282A Other tear of lateral meniscus, current injury, left knee, initial encounter: Secondary | ICD-10-CM | POA: Diagnosis not present

## 2017-06-05 DIAGNOSIS — M5030 Other cervical disc degeneration, unspecified cervical region: Secondary | ICD-10-CM | POA: Diagnosis not present

## 2017-06-05 DIAGNOSIS — G4730 Sleep apnea, unspecified: Secondary | ICD-10-CM | POA: Diagnosis not present

## 2017-06-05 DIAGNOSIS — Y9389 Activity, other specified: Secondary | ICD-10-CM | POA: Diagnosis not present

## 2017-06-05 DIAGNOSIS — X500XXA Overexertion from strenuous movement or load, initial encounter: Secondary | ICD-10-CM | POA: Diagnosis not present

## 2017-06-05 HISTORY — DX: Nausea with vomiting, unspecified: R11.2

## 2017-06-05 HISTORY — DX: Sleep apnea, unspecified: G47.30

## 2017-06-05 HISTORY — DX: Other specified postprocedural states: Z98.890

## 2017-06-05 HISTORY — DX: Anemia, unspecified: D64.9

## 2017-06-05 LAB — BASIC METABOLIC PANEL
ANION GAP: 9 (ref 5–15)
BUN: 21 mg/dL — ABNORMAL HIGH (ref 6–20)
CALCIUM: 9.2 mg/dL (ref 8.9–10.3)
CO2: 25 mmol/L (ref 22–32)
Chloride: 102 mmol/L (ref 101–111)
Creatinine, Ser: 0.77 mg/dL (ref 0.44–1.00)
Glucose, Bld: 90 mg/dL (ref 65–99)
Potassium: 3.7 mmol/L (ref 3.5–5.1)
Sodium: 136 mmol/L (ref 135–145)

## 2017-06-05 LAB — CBC
HEMATOCRIT: 37.8 % (ref 36.0–46.0)
Hemoglobin: 12.8 g/dL (ref 12.0–15.0)
MCH: 27.8 pg (ref 26.0–34.0)
MCHC: 33.9 g/dL (ref 30.0–36.0)
MCV: 82.2 fL (ref 78.0–100.0)
PLATELETS: 272 10*3/uL (ref 150–400)
RBC: 4.6 MIL/uL (ref 3.87–5.11)
RDW: 14.8 % (ref 11.5–15.5)
WBC: 6.3 10*3/uL (ref 4.0–10.5)

## 2017-06-05 LAB — SURGICAL PCR SCREEN
MRSA, PCR: NEGATIVE
STAPHYLOCOCCUS AUREUS: NEGATIVE

## 2017-06-05 NOTE — H&P (Signed)
Chief Complaint  Patient presents with  . Knee Pain      surgical consult left knee ACL tear       56 year old female injured her left knee pushing a lawn washing machine. She tore the anterior cruciate ligament and lateral meniscus. She presents for evaluation and treatment   PAIN LOCATION POSTERIOR ASPECT OF THE KNEE  DOI July 28TH  PAIN IS DULL PAIN IS MODERATE  SHE SAYS SHE CAN WALK STRAIGHT FINE, BUT IF SHE TRIES TO TURN THEN SHE FEELS PAIN        Review of Systems  Musculoskeletal: Positive for joint pain.  All other systems reviewed and are negative.       Past Medical History:  Diagnosis Date  . DDD (degenerative disc disease), cervical    . DJD of shoulder      left  . GERD (gastroesophageal reflux disease)    . Hiatal hernia    . Muscle spasm      "she has recurrent muscle spasms all over for as long as I've known her" per husband  . Pain in right shoulder             Past Surgical History:  Procedure Laterality Date  . ESOPHAGOGASTRODUODENOSCOPY   09/27/10    ZOX:WRUEAV without barrett mass or structures/large HH/she was dilated to 16 mm. gastritis on path.  . FERTILITY SURGERY      . lymphectomy        age 61  . SHOULDER SURGERY Right             Family History  Problem Relation Age of Onset  . Adopted: Yes           Social History   Substance Use Topics   . Smoking status: Former Games developer   . Smokeless tobacco: Never Used         Comment: remotely   . Alcohol use 0.0 oz/week        Comment: occ       There were no vitals taken for this visit.   Physical Exam  Constitutional: She is oriented to person, place, and time. She appears well-developed and well-nourished.  Musculoskeletal:       Right knee: Normal.  Gait: limp; knee brace   Neurological: She is alert and oriented to person, place, and time.  Psychiatric: She has a normal mood and affect.  Vitals reviewed.     Left Knee Exam  Swelling: Mild Effusion: Yes   Tenderness  The  patient is experiencing tenderness in the posterior .   Range of Motion  Extension: 0 Flexion:     100   Muscle Strength  Normal left knee strength   Tests  McMurrays:  Lateral - n/t Lachman:  Anterior - Positive    Posterior - Negative Drawer:       Anterior - Positive      Varus:  Negative Valgus: Negative   Comments:  Neurovascular exam is normal    Right Knee Exam  Right knee exam is normal.   Tenderness  None   Range of Motion  Normal right knee ROM   Muscle Strength  Normal right knee strength   Tests  Drawer:       Anterior - Negative         MRI 05/2017 IMPRESSION: Complete ACL tear from its femoral attachment with associated small bone contusion in the lateral tibial plateau and a small joint effusion.   Small horizontal tear  central posterior horn lateral meniscus.   Small Baker's cyst.     Electronically Signed   By: Drusilla Kannerhomas  Dalessio M.D.   On: 05/24/2017 14:42         Encounter Diagnoses  Name Primary?  . Derangement of lateral meniscus of left knee Yes  . Anterior cruciate ligament complete tear, left, initial encounter          PLAN:   She has as I told her choice of acl reconstruction or arthroscopy only. She has no routine activity involving cutting pivoting. She does hike occasionally   My advice is left knee arthroscopy lateral menisectomy brace and rehab If she has disabling instability later then acl reconstruction can be done    OOW 2 WEEKS    SURGERY THURS    This procedure has been fully reviewed with the patient and written informed consent has been obtained. RISKS PERTAINING TO THIS SURGERY S DISCUSSED WITH HER  BLEEDING INFECTION PAIN INSTABILITY DVT PE

## 2017-06-06 ENCOUNTER — Ambulatory Visit (HOSPITAL_COMMUNITY): Payer: BC Managed Care – PPO | Admitting: Anesthesiology

## 2017-06-06 ENCOUNTER — Encounter (HOSPITAL_COMMUNITY)
Admission: RE | Disposition: A | Discharge status: Home / Self Care | Payer: Self-pay | Source: Ambulatory Visit | Attending: Orthopedic Surgery

## 2017-06-06 ENCOUNTER — Ambulatory Visit (HOSPITAL_COMMUNITY)
Admission: RE | Admit: 2017-06-06 | Discharge: 2017-06-06 | Disposition: A | Discharge status: Home / Self Care | Payer: BC Managed Care – PPO | Source: Ambulatory Visit | Attending: Orthopedic Surgery | Admitting: Orthopedic Surgery

## 2017-06-06 DIAGNOSIS — M1990 Unspecified osteoarthritis, unspecified site: Secondary | ICD-10-CM | POA: Insufficient documentation

## 2017-06-06 DIAGNOSIS — K449 Diaphragmatic hernia without obstruction or gangrene: Secondary | ICD-10-CM | POA: Insufficient documentation

## 2017-06-06 DIAGNOSIS — D649 Anemia, unspecified: Secondary | ICD-10-CM | POA: Insufficient documentation

## 2017-06-06 DIAGNOSIS — S83512D Sprain of anterior cruciate ligament of left knee, subsequent encounter: Secondary | ICD-10-CM | POA: Diagnosis not present

## 2017-06-06 DIAGNOSIS — S83282A Other tear of lateral meniscus, current injury, left knee, initial encounter: Secondary | ICD-10-CM | POA: Diagnosis not present

## 2017-06-06 DIAGNOSIS — X509XXA Other and unspecified overexertion or strenuous movements or postures, initial encounter: Secondary | ICD-10-CM | POA: Insufficient documentation

## 2017-06-06 DIAGNOSIS — G4730 Sleep apnea, unspecified: Secondary | ICD-10-CM | POA: Insufficient documentation

## 2017-06-06 DIAGNOSIS — S83282D Other tear of lateral meniscus, current injury, left knee, subsequent encounter: Secondary | ICD-10-CM

## 2017-06-06 DIAGNOSIS — Y9389 Activity, other specified: Secondary | ICD-10-CM | POA: Insufficient documentation

## 2017-06-06 DIAGNOSIS — X500XXA Overexertion from strenuous movement or load, initial encounter: Secondary | ICD-10-CM | POA: Insufficient documentation

## 2017-06-06 DIAGNOSIS — M94262 Chondromalacia, left knee: Secondary | ICD-10-CM | POA: Insufficient documentation

## 2017-06-06 DIAGNOSIS — S83512A Sprain of anterior cruciate ligament of left knee, initial encounter: Secondary | ICD-10-CM

## 2017-06-06 DIAGNOSIS — K219 Gastro-esophageal reflux disease without esophagitis: Secondary | ICD-10-CM | POA: Insufficient documentation

## 2017-06-06 DIAGNOSIS — M19012 Primary osteoarthritis, left shoulder: Secondary | ICD-10-CM | POA: Insufficient documentation

## 2017-06-06 DIAGNOSIS — M5030 Other cervical disc degeneration, unspecified cervical region: Secondary | ICD-10-CM | POA: Insufficient documentation

## 2017-06-06 DIAGNOSIS — Z87891 Personal history of nicotine dependence: Secondary | ICD-10-CM | POA: Insufficient documentation

## 2017-06-06 HISTORY — PX: KNEE ARTHROSCOPY WITH LATERAL MENISECTOMY: SHX6193

## 2017-06-06 SURGERY — ARTHROSCOPY, KNEE, WITH LATERAL MENISCECTOMY
Anesthesia: Spinal | Laterality: Left

## 2017-06-06 MED ORDER — MIDAZOLAM HCL 2 MG/2ML IJ SOLN
1.0000 mg | INTRAMUSCULAR | Status: AC
  Administered 2017-06-06: 2 mg via INTRAVENOUS

## 2017-06-06 MED ORDER — CEFAZOLIN SODIUM-DEXTROSE 2-4 GM/100ML-% IV SOLN
2.0000 g | INTRAVENOUS | Status: AC
  Administered 2017-06-06: 2 g via INTRAVENOUS

## 2017-06-06 MED ORDER — TRAMADOL HCL 50 MG PO TABS: 50.0000 mg | ORAL_TABLET | Freq: Four times a day (QID) | ORAL | 0 refills | Status: DC | PRN

## 2017-06-06 MED ORDER — SCOPOLAMINE 1 MG/3DAYS TD PT72
1.0000 | MEDICATED_PATCH | Freq: Once | TRANSDERMAL | Status: AC
  Administered 2017-06-06: 1.5 mg via TRANSDERMAL
  Administered 2017-06-06: 1 via TRANSDERMAL

## 2017-06-06 MED ORDER — PROPOFOL 10 MG/ML IV BOLUS
INTRAVENOUS | Status: DC | PRN
  Administered 2017-06-06 (×2): 10 mg via INTRAVENOUS

## 2017-06-06 MED ORDER — BUPIVACAINE-EPINEPHRINE (PF) 0.5% -1:200000 IJ SOLN
INTRAMUSCULAR | Status: AC
  Filled 2017-06-06: qty 60

## 2017-06-06 MED ORDER — EPHEDRINE SULFATE 50 MG/ML IJ SOLN
INTRAMUSCULAR | Status: AC
  Filled 2017-06-06: qty 1

## 2017-06-06 MED ORDER — PROPOFOL 500 MG/50ML IV EMUL
INTRAVENOUS | Status: DC | PRN
  Administered 2017-06-06: 100 ug/kg/min via INTRAVENOUS

## 2017-06-06 MED ORDER — PHENYLEPHRINE HCL 10 MG/ML IJ SOLN
INTRAMUSCULAR | Status: DC | PRN
  Administered 2017-06-06: 40 ug via INTRAVENOUS
  Administered 2017-06-06: 80 ug via INTRAVENOUS
  Administered 2017-06-06 (×4): 40 ug via INTRAVENOUS
  Administered 2017-06-06: 80 ug via INTRAVENOUS

## 2017-06-06 MED ORDER — MIDAZOLAM HCL 2 MG/2ML IJ SOLN
INTRAMUSCULAR | Status: AC
  Filled 2017-06-06: qty 2

## 2017-06-06 MED ORDER — ONDANSETRON HCL 4 MG/2ML IJ SOLN
INTRAMUSCULAR | Status: AC
  Filled 2017-06-06: qty 2

## 2017-06-06 MED ORDER — SODIUM CHLORIDE 0.9 % IR SOLN
Status: DC | PRN
  Administered 2017-06-06 (×3): 3000 mL

## 2017-06-06 MED ORDER — FENTANYL CITRATE (PF) 100 MCG/2ML IJ SOLN
INTRAMUSCULAR | Status: AC
  Filled 2017-06-06: qty 2

## 2017-06-06 MED ORDER — MIDAZOLAM HCL 5 MG/5ML IJ SOLN
INTRAMUSCULAR | Status: DC | PRN
  Administered 2017-06-06: .5 mg via INTRAVENOUS
  Administered 2017-06-06: 1 mg via INTRAVENOUS
  Administered 2017-06-06: .5 mg via INTRAVENOUS

## 2017-06-06 MED ORDER — EPHEDRINE SULFATE 50 MG/ML IJ SOLN
INTRAMUSCULAR | Status: DC | PRN
  Administered 2017-06-06 (×4): 5 mg via INTRAVENOUS

## 2017-06-06 MED ORDER — BUPIVACAINE IN DEXTROSE 0.75-8.25 % IT SOLN
INTRATHECAL | Status: DC | PRN
  Administered 2017-06-06: 11.25 mg via INTRATHECAL

## 2017-06-06 MED ORDER — MIDAZOLAM HCL 2 MG/2ML IJ SOLN
2.0000 mg | Freq: Once | INTRAMUSCULAR | Status: AC
  Administered 2017-06-06: 2 mg via INTRAVENOUS

## 2017-06-06 MED ORDER — SODIUM CHLORIDE 0.9 % IJ SOLN
INTRAMUSCULAR | Status: AC
  Filled 2017-06-06: qty 10

## 2017-06-06 MED ORDER — FENTANYL CITRATE (PF) 100 MCG/2ML IJ SOLN
25.0000 ug | Freq: Once | INTRAMUSCULAR | Status: AC
  Administered 2017-06-06: 25 ug via INTRAVENOUS

## 2017-06-06 MED ORDER — FENTANYL CITRATE (PF) 100 MCG/2ML IJ SOLN: 25.0000 ug | INTRAMUSCULAR | Status: DC | PRN

## 2017-06-06 MED ORDER — EPINEPHRINE PF 1 MG/ML IJ SOLN
INTRAMUSCULAR | Status: AC
  Filled 2017-06-06: qty 5

## 2017-06-06 MED ORDER — CEFAZOLIN SODIUM-DEXTROSE 2-4 GM/100ML-% IV SOLN
INTRAVENOUS | Status: AC
  Filled 2017-06-06: qty 100

## 2017-06-06 MED ORDER — POVIDONE-IODINE 10 % EX SWAB: 2.0000 "application " | Freq: Once | CUTANEOUS | Status: DC

## 2017-06-06 MED ORDER — LIDOCAINE HCL (PF) 1 % IJ SOLN
INTRAMUSCULAR | Status: AC
  Filled 2017-06-06: qty 20

## 2017-06-06 MED ORDER — ONDANSETRON HCL 4 MG/2ML IJ SOLN
4.0000 mg | Freq: Once | INTRAMUSCULAR | Status: AC
  Administered 2017-06-06: 4 mg via INTRAVENOUS

## 2017-06-06 MED ORDER — PHENYLEPHRINE 40 MCG/ML (10ML) SYRINGE FOR IV PUSH (FOR BLOOD PRESSURE SUPPORT)
PREFILLED_SYRINGE | INTRAVENOUS | Status: AC
  Filled 2017-06-06: qty 10

## 2017-06-06 MED ORDER — SCOPOLAMINE 1 MG/3DAYS TD PT72
MEDICATED_PATCH | TRANSDERMAL | Status: AC
  Filled 2017-06-06: qty 1

## 2017-06-06 MED ORDER — PROPOFOL 10 MG/ML IV BOLUS
INTRAVENOUS | Status: AC
  Filled 2017-06-06: qty 40

## 2017-06-06 MED ORDER — BUPIVACAINE-EPINEPHRINE (PF) 0.5% -1:200000 IJ SOLN
INTRAMUSCULAR | Status: DC | PRN
  Administered 2017-06-06 (×2): 30 mL

## 2017-06-06 MED ORDER — LACTATED RINGERS IV SOLN
INTRAVENOUS | Status: DC
  Administered 2017-06-06: 09:00:00 via INTRAVENOUS

## 2017-06-06 MED ORDER — CHLORHEXIDINE GLUCONATE 4 % EX LIQD: 60.0000 mL | Freq: Once | CUTANEOUS | Status: DC

## 2017-06-06 MED ORDER — BUPIVACAINE IN DEXTROSE 0.75-8.25 % IT SOLN
INTRATHECAL | Status: AC
  Filled 2017-06-06: qty 2

## 2017-06-06 MED ORDER — ASPIRIN 325 MG PO TBEC: 325.0000 mg | DELAYED_RELEASE_TABLET | Freq: Every day | ORAL | 0 refills | Status: DC

## 2017-06-06 SURGICAL SUPPLY — 43 items
BAG HAMPER (MISCELLANEOUS) ×2 IMPLANT
BANDAGE ELASTIC 6 LF NS (GAUZE/BANDAGES/DRESSINGS) ×2 IMPLANT
BIT DRILL 2.0MX128MM (BIT) ×2 IMPLANT
BLADE 11 SAFETY STRL DISP (BLADE) ×2 IMPLANT
BLADE AGGRESSIVE PLUS 4.0 (BLADE) ×2 IMPLANT
CHLORAPREP W/TINT 26ML (MISCELLANEOUS) ×2 IMPLANT
CLOTH BEACON ORANGE TIMEOUT ST (SAFETY) ×2 IMPLANT
COOLER CRYO IC GRAV AND TUBE (ORTHOPEDIC SUPPLIES) ×2 IMPLANT
CUFF CRYO KNEE LG 20X31 COOLER (ORTHOPEDIC SUPPLIES) ×2 IMPLANT
DECANTER SPIKE VIAL GLASS SM (MISCELLANEOUS) ×4 IMPLANT
GAUZE SPONGE 4X4 12PLY STRL (GAUZE/BANDAGES/DRESSINGS) ×2 IMPLANT
GAUZE SPONGE 4X4 16PLY XRAY LF (GAUZE/BANDAGES/DRESSINGS) ×2 IMPLANT
GAUZE XEROFORM 5X9 LF (GAUZE/BANDAGES/DRESSINGS) ×2 IMPLANT
GLOVE BIOGEL PI IND STRL 7.0 (GLOVE) ×2 IMPLANT
GLOVE BIOGEL PI INDICATOR 7.0 (GLOVE) ×2
GLOVE ECLIPSE 6.5 STRL STRAW (GLOVE) ×2 IMPLANT
GLOVE SKINSENSE NS SZ8.0 LF (GLOVE) ×1
GLOVE SKINSENSE STRL SZ8.0 LF (GLOVE) ×1 IMPLANT
GLOVE SS N UNI LF 8.5 STRL (GLOVE) ×2 IMPLANT
GOWN STRL REUS W/TWL LRG LVL3 (GOWN DISPOSABLE) ×2 IMPLANT
GOWN STRL REUS W/TWL XL LVL3 (GOWN DISPOSABLE) ×2 IMPLANT
HLDR LEG FOAM (MISCELLANEOUS) ×1 IMPLANT
IV NS IRRIG 3000ML ARTHROMATIC (IV SOLUTION) ×6 IMPLANT
KIT BLADEGUARD II DBL (SET/KITS/TRAYS/PACK) ×2 IMPLANT
KIT ROOM TURNOVER AP CYSTO (KITS) ×2 IMPLANT
LEG HOLDER FOAM (MISCELLANEOUS) ×1
MANIFOLD NEPTUNE II (INSTRUMENTS) ×2 IMPLANT
MARKER SKIN DUAL TIP RULER LAB (MISCELLANEOUS) ×2 IMPLANT
NEEDLE HYPO 18GX1.5 BLUNT FILL (NEEDLE) ×2 IMPLANT
NEEDLE HYPO 21X1.5 SAFETY (NEEDLE) ×2 IMPLANT
NEEDLE SPNL 18GX3.5 QUINCKE PK (NEEDLE) ×2 IMPLANT
NS IRRIG 1000ML POUR BTL (IV SOLUTION) ×4 IMPLANT
PACK ARTHRO LIMB DRAPE STRL (MISCELLANEOUS) ×2 IMPLANT
PAD ABD 5X9 TENDERSORB (GAUZE/BANDAGES/DRESSINGS) ×2 IMPLANT
PAD ARMBOARD 7.5X6 YLW CONV (MISCELLANEOUS) ×2 IMPLANT
PADDING CAST COTTON 6X4 STRL (CAST SUPPLIES) ×2 IMPLANT
SET ARTHROSCOPY INST (INSTRUMENTS) ×2 IMPLANT
SET ARTHROSCOPY PUMP TUBE (IRRIGATION / IRRIGATOR) ×2 IMPLANT
SET BASIN LINEN APH (SET/KITS/TRAYS/PACK) ×2 IMPLANT
SUT ETHILON 3 0 FSL (SUTURE) ×2 IMPLANT
SYR 30ML LL (SYRINGE) ×2 IMPLANT
SYRINGE 10CC LL (SYRINGE) ×2 IMPLANT
TUBE CONNECTING 12X1/4 (SUCTIONS) ×6 IMPLANT

## 2017-06-06 NOTE — Anesthesia Postprocedure Evaluation (Signed)
Anesthesia Post Note  Patient: Alyssa Romero  Procedure(s) Performed: Procedure(s) (LRB): ARTHROSCOPY LEFT KNEE WITH PARTIAL LATERAL MENISECTOMY (Left)  Patient location during evaluation: PACU Anesthesia Type: Spinal Level of consciousness: awake and alert Pain management: satisfactory to patient Vital Signs Assessment: post-procedure vital signs reviewed and stable Respiratory status: spontaneous breathing Cardiovascular status: stable Postop Assessment: spinal receding and no signs of nausea or vomiting Anesthetic complications: no     Last Vitals:  Vitals:   06/06/17 1045 06/06/17 1100  BP: (!) 90/47 (!) 93/53  Pulse: 72 73  Resp: 10 16  Temp:    SpO2: 100% 99%    Last Pain:  Vitals:   06/06/17 0810  PainSc: 4                  Melizza Kanode

## 2017-06-06 NOTE — Anesthesia Procedure Notes (Signed)
Spinal  Patient location during procedure: OR Staffing Resident/CRNA: Franco NonesYATES, Juanelle Trueheart S Preanesthetic Checklist Completed: patient identified, site marked, surgical consent, pre-op evaluation, timeout performed, IV checked, risks and benefits discussed and monitors and equipment checked Spinal Block Patient position: left lateral decubitus Prep: Betadine Patient monitoring: heart rate, cardiac monitor, continuous pulse ox and blood pressure Approach: right paramedian Location: L3-4 Injection technique: single-shot Needle Needle type: Spinocan  Needle gauge: 22 G Needle length: 9 cm Assessment Sensory level: T6 Additional Notes ATTEMPTS: 1 TRAY ID: lot: [id number] GTIN: [id number] TRAY EXPIRATION DATE:  2019-07-22

## 2017-06-06 NOTE — Brief Op Note (Signed)
06/06/2017  10:02 AM  PATIENT:  Alyssa Romero  56 y.o. female  PRE-OPERATIVE DIAGNOSIS:  Torn lateral meniscus and anterior cruciate ligament left knee  POST-OPERATIVE DIAGNOSIS:  Torn Lateral Meniscus and Partial Torn Anterior Cruciate Ligament Left Knee  PROCEDURE:  Procedure(s): ARTHROSCOPY LEFT KNEE WITH PARTIAL LATERAL MENISECTOMY (Left)  Operative findings the patient's anterior drawer tests was trace positive with firm endpoint, glide on the pivot shift collateral ligament stable  Partial anterior cruciate ligament tear with intact anteromedial bundle femoral separation posterior lateral bundle  Small radial tear body lateral meniscus  Chondromalacia trochlea and patella grade 2  Knee arthroscopy dictation  The patient was identified in the preoperative holding area using 2 approved identification mechanisms. The chart was reviewed and updated. The surgical site was confirmed as left knee and marked with an indelible marker.  The patient was taken to the operating room for anesthesia. After successful  spinal anesthesia, 2 g Ancef was used as IV antibiotics.  The patient was placed in the supine position with the (left) the operative extremity in an arthroscopic leg holder and the opposite extremity in a padded leg holder.  The timeout was executed.  A lateral portal was established with an 11 blade and the scope was introduced into the joint. A diagnostic arthroscopy was performed in circumferential manner examining the entire knee joint. A medial portal was established and the diagnostic arthroscopy was repeated using a probe to palpate intra-articular structures as they were encountered.   There was a radial tear of the lateral meniscus as noted above. The chondral surfaces of the lateral compartment were normal. The lateral wall was not empty although with probing you could tell from the hemorrhage that the posterior lateral bundle had been ruptured. The anteromedial  bundle was still intact. There was firm tension to the anterior cruciate ligament on drawer and probing.  The medial compartment was normal including medial meniscus and articular structures  The patellofemoral area median ridge patella grade 2 chondromalacia with crabmeat appearance and trochlear matching lesion with fissuring  The lateral meniscus was resected using a duckbill forceps. The meniscal fragments were removed with a motorized shaver. The meniscus was balanced with motorized shaver and a 50 ArthroCare wand until a stable rim was obtained.  The arthroscopic pump was placed on the wash mode and any excess debris was removed from the joint using suction.  60 cc of Marcaine with epinephrine was injected through the arthroscope.  The portals were closed with 3-0 nylon suture.  A sterile bandage, Ace wrap and Cryo/Cuff was placed and the Cryo/Cuff was activated. The patient was taken to the recovery room in stable condition.  SURGEON:  Surgeon(s) and Role:    * Litsy Epting E, MD - Primary  PHYSICIAN ASSISTANT:   ASSISTANTS: none   ANESTHESIA:   general  EBL:  Total I/O In: 900 [I.V.:900] Out: 0   BLOOD ADMINISTERED:none  DRAINS: none   LOCAL MEDICATIONS USED:  MARCAINE    and Amount: 60 ml  SPECIMEN:  No Specimen  DISPOSITION OF SPECIMEN:  N/A  COUNTS:  YES  TOURNIQUET:    DICTATION: .Dragon Dictation  PLAN OF CARE: Discharge to home after PACU  PATIENT DISPOSITION:  PACU - hemodynamically stable.   Delay start of Pharmacological VTE agent (>24hrs) due to surgical blood loss or risk of bleeding: yes  29881  

## 2017-06-06 NOTE — Anesthesia Procedure Notes (Signed)
Procedure Name: MAC Date/Time: 06/06/2017 8:52 AM Performed by: Vista Deck Pre-anesthesia Checklist: Patient identified, Emergency Drugs available, Suction available, Timeout performed and Patient being monitored Patient Re-evaluated:Patient Re-evaluated prior to induction Oxygen Delivery Method: Non-rebreather mask

## 2017-06-06 NOTE — Transfer of Care (Signed)
Immediate Anesthesia Transfer of Care Note  Patient: Alyssa Romero  Procedure(s) Performed: Procedure(s): ARTHROSCOPY LEFT KNEE WITH PARTIAL LATERAL MENISECTOMY (Left)  Patient Location: PACU  Anesthesia Type:Spinal  Level of Consciousness: awake, alert  and patient cooperative  Airway & Oxygen Therapy: Patient Spontanous Breathing and non-rebreather face mask  Post-op Assessment: Report given to RN and Post -op Vital signs reviewed and stable  Post vital signs: Reviewed and stable  Last Vitals: There were no vitals filed for this visit.  Last Pain:  Vitals:   06/06/17 0810  PainSc: 4       Patients Stated Pain Goal: 8 (06/06/17 0810)  Complications: No apparent anesthesia complications

## 2017-06-06 NOTE — Op Note (Signed)
06/06/2017  10:02 AM  PATIENT:  Alyssa Romero  56 y.o. female  PRE-OPERATIVE DIAGNOSIS:  Torn lateral meniscus and anterior cruciate ligament left knee  POST-OPERATIVE DIAGNOSIS:  Torn Lateral Meniscus and Partial Torn Anterior Cruciate Ligament Left Knee  PROCEDURE:  Procedure(s): ARTHROSCOPY LEFT KNEE WITH PARTIAL LATERAL MENISECTOMY (Left)  Operative findings the patient's anterior drawer tests was trace positive with firm endpoint, glide on the pivot shift collateral ligament stable  Partial anterior cruciate ligament tear with intact anteromedial bundle femoral separation posterior lateral bundle  Small radial tear body lateral meniscus  Chondromalacia trochlea and patella grade 2  Knee arthroscopy dictation  The patient was identified in the preoperative holding area using 2 approved identification mechanisms. The chart was reviewed and updated. The surgical site was confirmed as left knee and marked with an indelible marker.  The patient was taken to the operating room for anesthesia. After successful  spinal anesthesia, 2 g Ancef was used as IV antibiotics.  The patient was placed in the supine position with the (left) the operative extremity in an arthroscopic leg holder and the opposite extremity in a padded leg holder.  The timeout was executed.  A lateral portal was established with an 11 blade and the scope was introduced into the joint. A diagnostic arthroscopy was performed in circumferential manner examining the entire knee joint. A medial portal was established and the diagnostic arthroscopy was repeated using a probe to palpate intra-articular structures as they were encountered.   There was a radial tear of the lateral meniscus as noted above. The chondral surfaces of the lateral compartment were normal. The lateral wall was not empty although with probing you could tell from the hemorrhage that the posterior lateral bundle had been ruptured. The anteromedial  bundle was still intact. There was firm tension to the anterior cruciate ligament on drawer and probing.  The medial compartment was normal including medial meniscus and articular structures  The patellofemoral area median ridge patella grade 2 chondromalacia with crabmeat appearance and trochlear matching lesion with fissuring  The lateral meniscus was resected using a duckbill forceps. The meniscal fragments were removed with a motorized shaver. The meniscus was balanced with motorized shaver and a 50 ArthroCare wand until a stable rim was obtained.  The arthroscopic pump was placed on the wash mode and any excess debris was removed from the joint using suction.  60 cc of Marcaine with epinephrine was injected through the arthroscope.  The portals were closed with 3-0 nylon suture.  A sterile bandage, Ace wrap and Cryo/Cuff was placed and the Cryo/Cuff was activated. The patient was taken to the recovery room in stable condition.  SURGEON:  Surgeon(s) and Role:    * Vickki HearingHarrison, Nehal Witting E, MD - Primary  PHYSICIAN ASSISTANT:   ASSISTANTS: none   ANESTHESIA:   general  EBL:  Total I/O In: 900 [I.V.:900] Out: 0   BLOOD ADMINISTERED:none  DRAINS: none   LOCAL MEDICATIONS USED:  MARCAINE    and Amount: 60 ml  SPECIMEN:  No Specimen  DISPOSITION OF SPECIMEN:  N/A  COUNTS:  YES  TOURNIQUET:    DICTATION: .Dragon Dictation  PLAN OF CARE: Discharge to home after PACU  PATIENT DISPOSITION:  PACU - hemodynamically stable.   Delay start of Pharmacological VTE agent (>24hrs) due to surgical blood loss or risk of bleeding: yes  [id number]

## 2017-06-06 NOTE — Interval H&P Note (Signed)
History and Physical Interval Note:  06/06/2017 8:47 AM  Alyssa Romero  has presented today for surgery, with the diagnosis of Torn lateral meniscus and anterior cruciate ligament left knee  The various methods of treatment have been discussed with the patient and family. After consideration of risks, benefits and other options for treatment, the patient has consented to  Procedure(s): ARTHROSCOPY LEFT KNEE WITH PARTIAL LATERAL MENISECTOMY (Left) as a surgical intervention .  The patient's history has been reviewed, patient examined, no change in status, stable for surgery.  I have reviewed the patient's chart and labs.  Questions were answered to the patient's satisfaction.     Fuller CanadaStanley Bethel Gaglio

## 2017-06-06 NOTE — Anesthesia Preprocedure Evaluation (Signed)
Anesthesia Evaluation  Patient identified by MRN, date of birth, ID band Patient awake    Reviewed: Allergy & Precautions, NPO status , Patient's Chart, lab work & pertinent test results  History of Anesthesia Complications (+) PONV and history of anesthetic complications  Airway Mallampati: II  TM Distance: >3 FB Neck ROM: Full    Dental  (+) Teeth Intact   Pulmonary sleep apnea , former smoker,    breath sounds clear to auscultation       Cardiovascular negative cardio ROS   Rhythm:Regular Rate:Normal     Neuro/Psych    GI/Hepatic hiatal hernia, GERD  ,  Endo/Other    Renal/GU      Musculoskeletal  (+) Arthritis ,   Abdominal   Peds  Hematology  (+) anemia ,   Anesthesia Other Findings   Reproductive/Obstetrics                             Anesthesia Physical Anesthesia Plan  ASA: II  Anesthesia Plan: Spinal   Post-op Pain Management:    Induction:   PONV Risk Score and Plan:   Airway Management Planned: Simple Face Mask  Additional Equipment:   Intra-op Plan:   Post-operative Plan:   Informed Consent: I have reviewed the patients History and Physical, chart, labs and discussed the procedure including the risks, benefits and alternatives for the proposed anesthesia with the patient or authorized representative who has indicated his/her understanding and acceptance.     Plan Discussed with:   Anesthesia Plan Comments:         Anesthesia Quick Evaluation

## 2017-06-07 ENCOUNTER — Encounter (HOSPITAL_COMMUNITY): Payer: Self-pay | Admitting: Orthopedic Surgery

## 2017-06-17 ENCOUNTER — Ambulatory Visit (INDEPENDENT_AMBULATORY_CARE_PROVIDER_SITE_OTHER): Payer: BC Managed Care – PPO | Admitting: Orthopedic Surgery

## 2017-06-17 ENCOUNTER — Encounter: Payer: Self-pay | Admitting: Orthopedic Surgery

## 2017-06-17 DIAGNOSIS — Z4889 Encounter for other specified surgical aftercare: Secondary | ICD-10-CM

## 2017-06-17 DIAGNOSIS — M23301 Other meniscus derangements, unspecified lateral meniscus, left knee: Secondary | ICD-10-CM

## 2017-06-17 NOTE — Progress Notes (Signed)
Postop visit #1 status post knee arthroscopy  Knee arthroscopy left knee August 16 this is postop day 11  The patient says it's the best surgery she's ever had   HPI  She had a partial anterior cruciate ligament tear it was not mid substance   Develop shingles, on prednisone recently stopped prednisone secondary to fluid retention    Surgery report 06/06/2017  10:02 AM  PATIENT:  Alyssa Romero  56 y.o. female  PRE-OPERATIVE DIAGNOSIS:  Torn lateral meniscus and anterior cruciate ligament left knee  POST-OPERATIVE DIAGNOSIS:  Torn Lateral Meniscus and Partial Torn Anterior Cruciate Ligament Left Knee  PROCEDURE:  Procedure(s): ARTHROSCOPY LEFT KNEE WITH PARTIAL LATERAL MENISECTOMY (Left)  Operative findings the patient's anterior drawer tests was trace positive with firm endpoint, glide on the pivot shift collateral ligament stable  Partial anterior cruciate ligament tear with intact anteromedial bundle femoral separation posterior lateral bundle  Small radial tear body lateral meniscus  Chondromalacia trochlea and patella grade 2   The portal sites are clean dry and intact, the sutures were removed.  The calf was supple soft and the Homans sign was negative.  The patient is regaining knee flexion  Physical therapy will be started at home   No bracing at present  She will call me if they hyperextension episodes become frequent  She did have 2 since the surgery  Status post knee arthroscopy  Follow-up in  3-4 WEEKS

## 2017-07-17 ENCOUNTER — Ambulatory Visit: Payer: BC Managed Care – PPO | Admitting: Orthopedic Surgery

## 2017-07-24 ENCOUNTER — Ambulatory Visit (INDEPENDENT_AMBULATORY_CARE_PROVIDER_SITE_OTHER): Payer: BC Managed Care – PPO | Admitting: Orthopedic Surgery

## 2017-07-24 ENCOUNTER — Encounter: Payer: Self-pay | Admitting: Orthopedic Surgery

## 2017-07-24 VITALS — BP 110/73 | HR 90 | Ht 67.0 in | Wt 256.0 lb

## 2017-07-24 DIAGNOSIS — M23301 Other meniscus derangements, unspecified lateral meniscus, left knee: Secondary | ICD-10-CM

## 2017-07-24 DIAGNOSIS — Z4889 Encounter for other specified surgical aftercare: Secondary | ICD-10-CM

## 2017-07-24 DIAGNOSIS — S83512D Sprain of anterior cruciate ligament of left knee, subsequent encounter: Secondary | ICD-10-CM

## 2017-07-24 NOTE — Progress Notes (Signed)
This is a follow-up visit after arthroscopy of the left knee the patient had an anterior cruciate ligament injury which we treated with arthroscopic debridement on August 16. Her anterior cruciate ligament tear was not mid substance, the lateral meniscus was torn and she had a partial lateral meniscectomy  Her knee is stable  She's only had one hyperextension episode. She has occasional clicking. She's been able tolerate all activities of daily living  We recommend six-month follow-up

## 2018-01-20 ENCOUNTER — Encounter: Payer: Self-pay | Admitting: Orthopedic Surgery

## 2018-01-20 ENCOUNTER — Ambulatory Visit: Payer: BC Managed Care – PPO | Admitting: Orthopedic Surgery

## 2019-02-04 ENCOUNTER — Telehealth: Payer: BC Managed Care – PPO | Admitting: Family

## 2019-02-04 DIAGNOSIS — J069 Acute upper respiratory infection, unspecified: Secondary | ICD-10-CM

## 2019-02-04 DIAGNOSIS — B9789 Other viral agents as the cause of diseases classified elsewhere: Principal | ICD-10-CM

## 2019-02-04 MED ORDER — BENZONATATE 100 MG PO CAPS: 100.0000 mg | ORAL_CAPSULE | Freq: Three times a day (TID) | ORAL | 0 refills | Status: DC | PRN

## 2019-02-04 NOTE — Progress Notes (Signed)
E-Visit for Corona Virus Screening  Based on your current symptoms, it seems unlikely that your symptoms are related to the Coronavirus.   Approximately 5 minutes was spent documenting and reviewing patient's chart.   Coronavirus disease 2019 (COVID-19) is a respiratory illness that can spread from person to person. The virus that causes COVID-19 is a new virus that was first identified in the country of [place] but is now found in multiple other countries and has spread to the [place].  Symptoms associated with the virus are mild to severe fever, cough, and shortness of breath. There is currently no vaccine to protect against COVID-19, and there is no specific antiviral treatment for the virus.   To be considered HIGH RISK for Coronavirus (COVID-19), you have to meet the following criteria:  . Traveled to [place], [place], [place], [place] or [place]; or in the [place] to [place], [place], [place], or [place]; and have fever, cough, and shortness of breath within the last 2 weeks of travel OR  . Been in close contact with a person diagnosed with COVID-19 within the last 2 weeks and have fever, cough, and shortness of breath  . IF YOU DO NOT MEET THESE CRITERIA, YOU ARE CONSIDERED LOW RISK FOR COVID-19.   It is vitally important that if you feel that you have an infection such as this virus or any other virus that you stay home and away from places where you may spread it to others.  You should self-quarantine for 14 days if you have symptoms that could potentially be coronavirus and avoid contact with people age 65 and older.   You can use medication such as A prescription cough medication called Tessalon Perles 100 mg. You may take 1-2 capsules every 8 hours as needed for cough  You may also take acetaminophen (Tylenol) as needed for fever.   Reduce your risk of any infection by using the same precautions used for avoiding the common cold or flu:  . Wash your hands often with  soap and warm water for at least 20 seconds.  If soap and water are not readily available, use an alcohol-based hand sanitizer with at least 60% alcohol.  . If coughing or sneezing, cover your mouth and nose by coughing or sneezing into the elbow areas of your shirt or coat, into a tissue or into your sleeve (not your hands). . Avoid shaking hands with others and consider head nods or verbal greetings only. . Avoid touching your eyes, nose, or mouth with unwashed hands.  . Avoid close contact with people who are sick. . Avoid places or events with large numbers of people in one location, like concerts or sporting events. . Carefully consider travel plans you have or are making. . If you are planning any travel outside or inside the [place], visit the CDC's Travelers' Health webpage for the latest health notices. . If you have some symptoms but not all symptoms, continue to monitor at home and seek medical attention if your symptoms worsen. . If you are having a medical emergency, call 911.  HOME CARE . Only take medications as instructed by your medical team. . Drink plenty of fluids and get plenty of rest. . A steam or ultrasonic humidifier can help if you have congestion.   GET HELP RIGHT AWAY IF: . You develop worsening fever. . You become short of breath . You cough up blood. . Your symptoms become more severe MAKE SURE YOU     Understand these instructions.  Will watch your condition.  Will get help right away if you are not doing well or get worse.  Your e-visit answers were reviewed by a board certified advanced clinical practitioner to complete your personal care plan.  Depending on the condition, your plan could have included both over the counter or prescription medications.  If there is a problem please reply once you have received a response from your provider. Your safety is important to us.  If you have drug allergies check your prescription carefully.    You can use MyChart to  ask questions about today's visit, request a non-urgent call back, or ask for a work or school excuse for 24 hours related to this e-Visit. If it has been greater than 24 hours you will need to follow up with your provider, or enter a new e-Visit to address those concerns. You will get an e-mail in the next two days asking about your experience.  I hope that your e-visit has been valuable and will speed your recovery. Thank you for using e-visits.    

## 2019-02-05 ENCOUNTER — Telehealth: Payer: BC Managed Care – PPO | Admitting: Physician Assistant

## 2019-02-05 DIAGNOSIS — N760 Acute vaginitis: Secondary | ICD-10-CM | POA: Diagnosis not present

## 2019-02-05 DIAGNOSIS — B9689 Other specified bacterial agents as the cause of diseases classified elsewhere: Secondary | ICD-10-CM | POA: Diagnosis not present

## 2019-02-05 MED ORDER — METRONIDAZOLE 500 MG PO TABS: 500.0000 mg | ORAL_TABLET | Freq: Two times a day (BID) | ORAL | 0 refills | Status: DC

## 2019-02-05 NOTE — Progress Notes (Signed)
I have spent 5 minutes in review of e-visit questionnaire, review and updating patient chart, medical decision making and response to patient.   Jillian Pianka Cody Joannah Gitlin, PA-C    

## 2019-02-05 NOTE — Progress Notes (Signed)

## 2019-07-02 ENCOUNTER — Encounter: Payer: Self-pay | Admitting: Gastroenterology

## 2019-07-23 ENCOUNTER — Encounter: Payer: Self-pay | Admitting: *Deleted

## 2019-07-23 ENCOUNTER — Other Ambulatory Visit: Payer: Self-pay

## 2019-07-23 ENCOUNTER — Ambulatory Visit: Payer: BC Managed Care – PPO | Admitting: Gastroenterology

## 2019-07-23 ENCOUNTER — Other Ambulatory Visit: Payer: Self-pay | Admitting: *Deleted

## 2019-07-23 ENCOUNTER — Encounter: Payer: Self-pay | Admitting: Gastroenterology

## 2019-07-23 DIAGNOSIS — K449 Diaphragmatic hernia without obstruction or gangrene: Secondary | ICD-10-CM

## 2019-07-23 DIAGNOSIS — R112 Nausea with vomiting, unspecified: Secondary | ICD-10-CM

## 2019-07-23 NOTE — Assessment & Plan Note (Signed)
ASSOCIATED WITH LARGE HIATAL HERNIA. COMPLICATED BY NAUSEA/VOMITING, PNEUMONIA.  COMPLETE upper GI series. NO NEED FOR EGD AT [facility] UNLESS CONCERNING FINDINGS ON UGI. SEE [facility] TO FIX YOUR HIATAL HERNIA.  EAT TO LIVE AND THINK OF FOOD AS MEDICINE. 75% OF YOUR PLATE SHOULD BE FRUITS/VEGGIES.  To have more energy, BETTER DIABETES CONTROL, and to lose weight:      1. CONTINUE YOUR WEIGHT LOSS EFFORTS. I RECOMMEND YOU READ AND FOLLOW RECOMMENDATIONS BY DR. MARK HYMAN, "10-DAY DETOX DIET", AND "WHAT THE HECK SHOULD I EAT?".   2. If you must eat bread, EAT EZEKIEL BREAD. IT IS IN THE FROZEN SECTION OF THE GROCERY STORE.   3. DRINK WATER WITH FRUIT OR CUCUMBER ADDED. YOUR URINE LIGHT SHOULD BE LIGHT YELLOW. AVOID SODA, GATORADE, ENERGY DRINKS, OR DIET SODA.    4. AVOID HIGH FRUCTOSE CORN SYRUP.    5. DO NOT chew SUGAR FREE GUM OR USE ARTIFICIAL SWEETENERS. IF NEEDED USE STEVIA AS A SWEETENER.   6. DO NOT EAT ENRICHED WHEAT FLOUR, PASTA, RICE, OR CEREAL.   7. ONLY EAT WILD CAUGHT SEAFOOD, GRASS FED BEEF OR CHICKEN, PORK FROM PASTURE RAISE PIGS, OR EGGS FROM PASTURE RAISED CHICKENS.   8. PRACTICE CHAIR YOGA FOR 15-30 MINS 3 OR 4 TIMES A WEEK AND PROGRESS TO HATHA YOGA OVER NEXT 6 MOS.   9. START TAKING A MULTIVITAMIN, VITAMIN B12, AND VITAMIN D3 2000 IU DAILY.  ADDITIONAL SUPPLEMENTS TO DECREASE CRAVING AND SUPPRESS YOUR APPETITE:    1. CINNAMON 500 MG EVERY AM PRIOR TO FIRST MEAL   2. CHROMIUM 400-500 MG WITH MEALS TWICE DAILY   3. GREEN TEA EXTRACT ONE DAILY  Please CALL or SEND me A MY CHART MESSAGE IF YOU HAVE QUESTIONS OR CONCERNS. FOLLOW UP IN 6 MOS.

## 2019-07-23 NOTE — Patient Instructions (Signed)
COMPLETE upper GI series.  SEE [facility] TO FIX YOUR HIATAL HERNIA.  EAT TO LIVE AND THINK OF FOOD AS MEDICINE. 75% OF YOUR PLATE SHOULD BE FRUITS/VEGGIES.  To have more energy, and to lose weight:      1. CONTINUE YOUR WEIGHT LOSS EFFORTS. I RECOMMEND YOU READ AND FOLLOW RECOMMENDATIONS BY DR. MARK HYMAN, "10-DAY DETOX DIET", AND "WHAT THE HECK SHOULD I EAT?".    2. If you must eat bread, EAT EZEKIEL BREAD. IT IS IN THE FROZEN SECTION OF THE GROCERY STORE.    3. DRINK WATER WITH FRUIT OR CUCUMBER ADDED. YOUR URINE SHOULD BE LIGHT YELLOW. AVOID SODA, GATORADE, ENERGY DRINKS, OR DIET SODA.     4. AVOID HIGH FRUCTOSE CORN SYRUP.     5. DO NOT chew SUGAR FREE GUM OR USE ARTIFICIAL SWEETENERS. IF NEEDED USE STEVIA AS A SWEETENER.    6. DO NOT EAT ENRICHED WHEAT FLOUR, PASTA, RICE, OR CEREAL.    7. ONLY EAT WILD CAUGHT SEAFOOD, GRASS FED BEEF OR CHICKEN, PORK FROM PASTURE RAISE PIGS, OR EGGS FROM PASTURE RAISED CHICKENS.    8. PRACTICE CHAIR YOGA FOR 15-30 MINS 3 OR 4 TIMES A WEEK AND PROGRESS TO HATHA YOGA OVER NEXT 6 MOS.    9. START TAKING A MULTIVITAMIN, VITAMIN B12, AND VITAMIN D3 2000 IU DAILY.   ADDITIONAL SUPPLEMENTS TO DECREASE CRAVING AND SUPPRESS YOUR APPETITE:    1. CINNAMON 500 MG EVERY AM PRIOR TO FIRST MEAL    2. CHROMIUM 400-500 MG WITH MEALS TWICE DAILY    3. GREEN TEA EXTRACT ONE DAILY  FOLLOW UP IN 6 MOS.

## 2019-07-23 NOTE — Progress Notes (Signed)
Subjective:    Patient ID: Alyssa Romero, female    DOB: Nov 08, 1960, 58 y.o.   MRN: [id number]  Practice, [facility]  HPI HAD HAD A HIATAL FOR YEARS. NOW HAVING SYMPTOMS: ADMITTED AFTER NAUSEA/VOMITING(VIOLENTLY) AUG 2020, Dx:PNA. CAN GET POUNDING HEAR AND TROUBLE BREATHING IF SHE EATS A LARGE MEAL. KNOWS SHE ASPIRATING AT TIMES. FOOD DOESN'T GO DOWN AND SITS IN ESOPHAGUS, CHEST PAIN MIDDLE AND SPREAD ACROSS THE TOP. NO WEIGHT LOSS. TRIED VEGAN AND NOW WEIGHT LOSS. NEVER TCS. Problems with sedation: HAD EGD 2011-PROBLEMS WITH CONSCIOUS SEDATION-IT DIDN'T WORK. NO RECENT UGI. TOLERATE OWN SECRETIONS AND LIQUIDS BETTER THAN SOLIDS. BMs: DAILY. FEELS LIKE SWALLOWS FINE BUT FOOD/SALIVA COMES AND OUT. HAPPENED ALL THE TIME UNTIL SHE CHANGED THE PORTION SIZES.  PT DENIES FEVER, CHILLS, HEMATOCHEZIA, HEMATEMESIS, melena, diarrhea, CHANGE IN BOWEL IN HABITS, constipation, abdominal pain, problems swallowing, OR heartburn or indigestion.  Past Medical History:  Diagnosis Date  . Anemia   . DDD (degenerative disc disease), cervical   . DJD of shoulder    left  . GERD (gastroesophageal reflux disease)   . Hiatal hernia   . Muscle spasm    "she has recurrent muscle spasms all over for as long as I've known her" per husband  . Pain in right shoulder   . PONV (postoperative nausea and vomiting)    sts "phenergan does not work".  . Sleep apnea    tested positive but did not obtain CPAP   Past Surgical History:  Procedure Laterality Date  . ESOPHAGOGASTRODUODENOSCOPY  09/27/10   VVO:HYWVPX without barrett mass or structures/large HH/she was dilated to 16 mm. gastritis on path.  . FERTILITY SURGERY    . KNEE ARTHROSCOPY WITH LATERAL MENISECTOMY Left 06/06/2017   Procedure: ARTHROSCOPY LEFT KNEE WITH PARTIAL LATERAL MENISECTOMY;  Surgeon: Vickki Hearing, MD;  Location: [facility];  Service: Orthopedics;  Laterality: Left;  . lymphectomy     age 1;left neck  . SHOULDER SURGERY Right    rotator cuff and bone spurs removed   Allergies  Allergen Reactions  . Statins Hives  . Decadron [Dexamethasone]     Decrease in blood pressure  . Sulfonamide Derivatives Other (See Comments)    Reaction unknown--adolescent allergy   Current Outpatient Medications  Medication Sig Dispense Refill  . NONE     .      Marland Kitchen       Family History  Adopted: Yes    Social History   Socioeconomic History  . Marital status: Legally Separated    Spouse name: Not on file  . Number of children: Not on file  . Years of education: Not on file  . Highest education level: Not on file  Occupational History  . Occupation: teacher-science  Social Needs  . Financial resource strain: Not on file  . Food insecurity    Worry: Not on file    Inability: Not on file  . Transportation needs    Medical: Not on file    Non-medical: Not on file  Tobacco Use  . Smoking status: Former Smoker    Packs/day: 2.50    Years: 10.00    Pack years: 25.00    Types: Cigarettes    Quit date: 06/05/1992    Years since quitting: 27.1  . Smokeless tobacco: Never Used  Substance and Sexual Activity  . Alcohol use: Yes    Alcohol/week: 0.0 standard drinks    Comment: occ  . Drug use: No  . Sexual activity:  Yes    Birth control/protection: Post-menopausal  Lifestyle  . Physical activity    Days per week: Not on file    Minutes per session: Not on file  . Stress: Not on file  Relationships  . Social Herbalist on phone: Not on file    Gets together: Not on file    Attends religious service: Not on file    Active member of club or organization: Not on file    Attends meetings of clubs or organizations: Not on file    Relationship status: Not on file  Other Topics Concern  . Not on file  Social History Narrative   [place]. ONE KID(BORN 2003) AND WANTS TOBE A NURSE. SPENDS FREE TIME: AT [place], [place].    Review of Systems PER HPI OTHERWISE ALL SYSTEMS  ARE NEGATIVE.    Objective:   Physical Exam Vitals signs reviewed.  Constitutional:      General: She is not in acute distress.    Appearance: She is well-developed.  HENT:     Head: Normocephalic and atraumatic.     Mouth/Throat:     Comments: MASK IN PLACE Eyes:     General: No scleral icterus.    Pupils: Pupils are equal, round, and reactive to light.  Neck:     Musculoskeletal: Normal range of motion and neck supple.  Cardiovascular:     Rate and Rhythm: Normal rate and regular rhythm.     Heart sounds: Normal heart sounds.  Pulmonary:     Effort: Pulmonary effort is normal. No respiratory distress.     Breath sounds: Normal breath sounds.  Abdominal:     General: Bowel sounds are normal. There is no distension.     Palpations: Abdomen is soft.     Tenderness: There is no abdominal tenderness.  Musculoskeletal:     Right lower leg: No edema.     Left lower leg: No edema.  Lymphadenopathy:     Cervical: No cervical adenopathy.  Neurological:     Mental Status: She is alert and oriented to person, place, and time.     Comments: NO FOCAL DEFICITS  Psychiatric:        Mood and Affect: Mood normal.        Behavior: Behavior normal.        Thought Content: Thought content normal.     Comments: NORMAL AFFECT        Assessment & Plan:

## 2019-07-30 ENCOUNTER — Ambulatory Visit (HOSPITAL_COMMUNITY): Payer: BC Managed Care – PPO

## 2019-08-19 ENCOUNTER — Other Ambulatory Visit: Payer: Self-pay

## 2019-08-19 DIAGNOSIS — Z20822 Contact with and (suspected) exposure to covid-19: Secondary | ICD-10-CM

## 2019-08-20 LAB — NOVEL CORONAVIRUS, NAA: SARS-CoV-2, NAA: NOT DETECTED

## 2019-08-26 ENCOUNTER — Ambulatory Visit (HOSPITAL_COMMUNITY): Payer: BC Managed Care – PPO

## 2019-12-09 ENCOUNTER — Other Ambulatory Visit: Payer: Self-pay

## 2019-12-09 ENCOUNTER — Encounter: Payer: Self-pay | Admitting: Obstetrics and Gynecology

## 2019-12-09 ENCOUNTER — Ambulatory Visit: Payer: BC Managed Care – PPO | Admitting: Obstetrics and Gynecology

## 2019-12-09 ENCOUNTER — Other Ambulatory Visit: Payer: Self-pay | Admitting: Obstetrics and Gynecology

## 2019-12-09 VITALS — BP 138/83 | HR 102 | Ht 67.0 in | Wt 274.0 lb

## 2019-12-09 DIAGNOSIS — N950 Postmenopausal bleeding: Secondary | ICD-10-CM

## 2019-12-09 NOTE — Progress Notes (Signed)
Patient ID: BAELYNN SCHMUHL, female   DOB: [birth year], 59 y.o.   MRN: [id number]   [facility] Clinic Visit  @DATE @            Patient name: CELEST REITZ MRN [name]  Date of birth: Aug 22, 1961  CC & HPI:  ELEORA SUTHERLAND is a 59 y.o. female NEW GYN presenting today for post menopausal bleeding. Last endo biopsy on 09/26/2010 showed scant benign columnar mucosa. Negative for malignancy.  Has been bleeding off and on x months.  ROS:  ROS +post menopausal bleeding +endometrial polyp on exam today -fever -cramping  Pertinent History Reviewed:   Reviewed:  Medical         Past Medical History:  Diagnosis Date  . Anemia   . DDD (degenerative disc disease), cervical   . DJD of shoulder    left  . GERD (gastroesophageal reflux disease)   . Hiatal hernia   . Muscle spasm    "she has recurrent muscle spasms all over for as long as I've known her" per husband  . Pain in right shoulder   . Pneumonia   . PONV (postoperative nausea and vomiting)    sts "phenergan does not work".  . Sleep apnea    tested positive but did not obtain CPAP                              Surgical Hx:    Past Surgical History:  Procedure Laterality Date  . ESOPHAGOGASTRODUODENOSCOPY  09/27/10   14/7/11 without barrett mass or structures/large HH/she was dilated to 16 mm. gastritis on path.  . FERTILITY SURGERY    . KNEE ARTHROSCOPY WITH LATERAL MENISECTOMY Left 06/06/2017   Procedure: ARTHROSCOPY LEFT KNEE WITH PARTIAL LATERAL MENISECTOMY;  Surgeon: 06/08/2017, MD;  Location: [facility];  Service: Orthopedics;  Laterality: Left;  . lymphectomy     age 1;left neck  . SHOULDER SURGERY Right    rotator cuff and bone spurs removed   Medications: Reviewed & Updated - see associated section                      No current outpatient medications on file.   Social History: Reviewed -  reports that she quit smoking about 27 years ago. Her smoking use included cigarettes. She has a 25.00  pack-year smoking history. She has never used smokeless tobacco.  Objective Findings:  Vitals: Blood pressure 138/83, pulse (!) 102, height 5\' 7"  (1.702 m), weight 274 lb (124.3 kg).  PHYSICAL EXAMINATION General appearance - alert, well appearing, and in no distress Mental status - normal mood, behavior, speech, dress, motor activity, and thought processes, affect appropriate to mood  PELVIC VAGINA: blood present UTERUS: endometrial polyp  Assessment & Plan:   A:  1. Post menopausal bleeding 2. Endometrial polyp.   P:  1. call with results 1 week 2. F/u in 1 week with transvaginal u/s   By signing my name below, I, Vickki Hearing, attest that this documentation has been prepared under the direction and in the presence of , MD. Electronically Signed: Arnette Norris Medical Scribe. 12/09/19. 3:21 PM.  I personally performed the services described in this documentation, which was SCRIBED in my presence. The recorded information has been reviewed and considered accurate. It has been edited as necessary during review. Arnette Norris, MD

## 2019-12-09 NOTE — Addendum Note (Signed)
Addended by: Colen Darling on: 12/09/2019 03:52 PM   Modules accepted: Orders

## 2019-12-22 ENCOUNTER — Other Ambulatory Visit: Payer: Self-pay | Admitting: Obstetrics and Gynecology

## 2019-12-22 DIAGNOSIS — N950 Postmenopausal bleeding: Secondary | ICD-10-CM

## 2019-12-23 ENCOUNTER — Other Ambulatory Visit: Payer: Self-pay

## 2019-12-23 ENCOUNTER — Ambulatory Visit (INDEPENDENT_AMBULATORY_CARE_PROVIDER_SITE_OTHER): Payer: BC Managed Care – PPO

## 2019-12-23 DIAGNOSIS — N950 Postmenopausal bleeding: Secondary | ICD-10-CM | POA: Diagnosis not present

## 2019-12-23 NOTE — Progress Notes (Addendum)
PELVIC US TA/TV:heterogeneous retroverted uterus with mult linear striations(? adenomyosis),mult simple nabothian cyst,thickened,heterogeneous,vascular endometrium with mult cystic areas,EEC 17 mm,normal ovaries,unable to slide left ovary,no free fluid,no pain during ultrasound  Chaperone Marylu Lund

## 2019-12-24 ENCOUNTER — Telehealth: Payer: Self-pay | Admitting: Obstetrics and Gynecology

## 2019-12-24 NOTE — Telephone Encounter (Signed)
Left message on pt's personal cell, with results of pelvic u/s. Will need to remove rest of endometrial polyp. This will be thru day Surgery, Hysteroscopy, dilation and curettage, removal of endometrial polyp. I will notify OR scheduler, Drue Stager.

## 2020-01-01 ENCOUNTER — Encounter: Payer: Self-pay | Admitting: Obstetrics and Gynecology

## 2020-01-01 ENCOUNTER — Other Ambulatory Visit: Payer: Self-pay

## 2020-01-01 ENCOUNTER — Other Ambulatory Visit: Payer: Self-pay | Admitting: Obstetrics and Gynecology

## 2020-01-01 ENCOUNTER — Ambulatory Visit (INDEPENDENT_AMBULATORY_CARE_PROVIDER_SITE_OTHER): Payer: BC Managed Care – PPO | Admitting: Obstetrics and Gynecology

## 2020-01-01 VITALS — BP 137/90 | HR 82 | Ht 67.0 in | Wt 274.8 lb

## 2020-01-01 DIAGNOSIS — N939 Abnormal uterine and vaginal bleeding, unspecified: Secondary | ICD-10-CM

## 2020-01-01 DIAGNOSIS — N950 Postmenopausal bleeding: Secondary | ICD-10-CM

## 2020-01-01 DIAGNOSIS — N840 Polyp of corpus uteri: Secondary | ICD-10-CM | POA: Diagnosis not present

## 2020-01-01 NOTE — Progress Notes (Signed)
Patient ID: Alyssa Romero, female   DOB: 1961/09/08, 59 y.o.   MRN: [id number]  Preoperative History and Physical  Alyssa Romero is a 59 y.o. G0P0000 here for surgical management of Endometrial polyp. No significant preoperative concerns.  Proposed surgery: D&C w/ hysteroscopy and endometrial polypectomy.  Past Medical History:  Diagnosis Date  . Anemia   . DDD (degenerative disc disease), cervical   . DJD of shoulder    left  . GERD (gastroesophageal reflux disease)   . Hiatal hernia   . Muscle spasm    "she has recurrent muscle spasms all over for as long as I've known her" per husband  . Pain in right shoulder   . Pneumonia   . PONV (postoperative nausea and vomiting)    sts "phenergan does not work".  . Sleep apnea    tested positive but did not obtain CPAP   Past Surgical History:  Procedure Laterality Date  . ESOPHAGOGASTRODUODENOSCOPY  09/27/10   LFY:BOFBPZ without barrett mass or structures/large HH/she was dilated to 16 mm. gastritis on path.  . FERTILITY SURGERY    . KNEE ARTHROSCOPY WITH LATERAL MENISECTOMY Left 06/06/2017   Procedure: ARTHROSCOPY LEFT KNEE WITH PARTIAL LATERAL MENISECTOMY;  Surgeon: Carole Civil, MD;  Location: [facility];  Service: Orthopedics;  Laterality: Left;  . lymphectomy     age 59;left neck  . SHOULDER SURGERY Right    rotator cuff and bone spurs removed   OB History  Gravida Para Term Preterm AB Living  0 0 0 0 0 0  SAB TAB Ectopic Multiple Live Births  0 0 0 0 0  Patient denies any other pertinent gynecologic issues.   No current outpatient medications on file prior to visit.   No current facility-administered medications on file prior to visit.   Allergies  Allergen Reactions  . Statins Hives  . Azithromycin     SEVERE ABDOMINAL CRAMPINP AND ITCHING  . Decadron [Dexamethasone]     Decrease in blood pressure  . Sulfonamide Derivatives Other (See Comments)    Reaction unknown--adolescent allergy    Social History:    reports that she quit smoking about 27 years ago. Her smoking use included cigarettes. She has a 25.00 pack-year smoking history. She has never used smokeless tobacco. She reports current alcohol use. She reports that she does not use drugs.  Family History  Adopted: Yes    Review of Systems: Noncontributory  PHYSICAL EXAM: Blood pressure 137/90, pulse 82, height 5\' 7"  (1.702 m), weight 274 lb 12.8 oz (124.6 kg). General appearance - alert, well appearing, and in no distress Chest - clear to auscultation, no wheezes, rales or rhonchi, symmetric air entry Heart - normal rate and regular rhythm Abdomen - soft, nontender, nondistended, no masses or organomegaly Pelvic -  VAGINA: normal appearing vagina  CERVIX: slightly deviated to the left, small, well supported, some spotting after exam UTERUS: uterus is small in size, and nontender, retroverted, well supported GCCHL collected  Extremities - peripheral pulses normal, no pedal edema, no clubbing or cyanosis  Labs: No results found for this or any previous visit (from the past 336 hour(s)).  Imaging Studies: US Transvaginal Non-OB  Result Date: 12/24/2019 GYNECOLOGIC SONOGRAM Alyssa Romero is a 59 y.o. G0P0000 No LMP recorded. Patient is postmenopausal.She is here for a pelvic sonogram for postmenopausal bleeding. Uterus                      7.25 x 3.9  x 4.2 cm, Total uterine volume 63 cc,heterogeneous retroverted uterus with mult linear striations(? adenomyosis),mult simple nabothian cyst Endometrium          17 mm, symmetrical, thickened,heterogeneous,vascular endometrium with mult cystic areas Right ovary             2 x 2.6 x 1.3 cm, wnl Left ovary                1.9 x 1.5 x 2.3 cm, wnl,unable to slide left ovary No free fluid Technician Comments: PELVIC US TA/TV:heterogeneous retroverted uterus with mult linear striations(? adenomyosis),mult simple nabothian cyst,thickened,heterogeneous,vascular endometrium with mult cystic areas,EEC  17 mm,normal ovaries,unable to slide left ovary,no free fluid,no pain during ultrasound Chaperone [employer] 12/23/2019 1:18 PM Clinical Impression and recommendations: I have reviewed the sonogram results above, combined with the patient's current clinical course, below are my impressions and any appropriate recommendations for management based on the sonographic findings. Uterus is normal size shape and contour, evidence of adenomyosis, clinically insignificant Endometrium is complex and thickened and will require formal pathologic evaluation Ovaries are normal [email or web address] Lazaro Arms 12/24/2019 7:03 AM   US PELVIS (TRANSABDOMINAL ONLY)  Result Date: 12/24/2019 GYNECOLOGIC SONOGRAM Alyssa Romero is a 59 y.o. G0P0000 No LMP recorded. Patient is postmenopausal.She is here for a pelvic sonogram for postmenopausal bleeding. Uterus                      7.25 x 3.9 x 4.2 cm, Total uterine volume 63 cc,heterogeneous retroverted uterus with mult linear striations(? adenomyosis),mult simple nabothian cyst Endometrium          17 mm, symmetrical, thickened,heterogeneous,vascular endometrium with mult cystic areas Right ovary             2 x 2.6 x 1.3 cm, wnl Left ovary                1.9 x 1.5 x 2.3 cm, wnl,unable to slide left ovary No free fluid Technician Comments: PELVIC US TA/TV:heterogeneous retroverted uterus with mult linear striations(? adenomyosis),mult simple nabothian cyst,thickened,heterogeneous,vascular endometrium with mult cystic areas,EEC 17 mm,normal ovaries,unable to slide left ovary,no free fluid,no pain during ultrasound Chaperone [employer] 12/23/2019 1:18 PM Clinical Impression and recommendations: I have reviewed the sonogram results above, combined with the patient's current clinical course, below are my impressions and any appropriate recommendations for management based on the sonographic findings. Uterus is normal size shape and  contour, evidence of adenomyosis, clinically insignificant Endometrium is complex and thickened and will require formal pathologic evaluation Ovaries are normal [email or web address] Lazaro Arms 12/24/2019 7:03 AM    Assessment: Patient Active Problem List   Diagnosis Date Noted  . Tear of lateral meniscus of left knee, current   . Left anterior cruciate ligament tear   . Esophageal dysphagia 09/27/2014  . Nausea with vomiting 09/27/2014  . Hiatal hernia 09/27/2014  . Lumbar spondylosis with myelopathy 02/04/2014  . RUPTURE ROTATOR CUFF 12/20/2009  . SHOULDER, ARTHRITIS, DEGEN./OSTEO 12/08/2009    Plan: Patient will undergo surgical management with D&C w/ hysteroscopy and endometrial polypectomy 01/26/2020. All questions answered. Prefers spinal due to medical issues with anesthesia.  Increase fluid and water intake.    By signing my name below, I, Arnette Norris, attest that this documentation has been prepared under the direction and in the presence of Tilda Burrow, MD. Electronically Signed: Arnette Norris Medical Scribe. 01/01/20. 10:09 AM.   I personally performed the services described  in this documentation, which was SCRIBED in my presence. The recorded information has been reviewed and considered accurate. It has been edited as necessary during review. Jonnie Kind, MD

## 2020-01-01 NOTE — Progress Notes (Signed)
Orders for procedure completed.for 01/26/2020 procedure.

## 2020-01-18 NOTE — Patient Instructions (Signed)
Alyssa Romero  01/18/2020     @PREFPERIOPPHARMACY @   Your procedure is scheduled on 01/26/2020 .  Report to [facility] at  0720  A.M.  Call this number if you have problems the morning of surgery:  [phone]   Remember:  Do not eat or drink after midnight.                       Take these medicines the morning of surgery with A SIP OF WATER  None    Do not wear jewelry, make-up or nail polish.  Do not wear lotions, powders, or perfumes. Please wear deodorant and brush your teeth.  Do not shave 48 hours prior to surgery.  Men may shave face and neck.  Do not bring valuables to the hospital.  [facility] is not responsible for any belongings or valuables.  Contacts, dentures or bridgework may not be worn into surgery.  Leave your suitcase in the car.  After surgery it may be brought to your room.  For patients admitted to the hospital, discharge time will be determined by your treatment team.  Patients discharged the day of surgery will not be allowed to drive home.   Name and phone number of your driver:   family Special instructions: DO NOT smoke the morning of your procedure.  Please read over the following fact sheets that you were given. Anesthesia Post-op Instructions and Care and Recovery After Surgery       Hysteroscopy, Care After This sheet gives you information about how to care for yourself after your procedure. Your health care provider may also give you more specific instructions. If you have problems or questions, contact your health care provider. What can I expect after the procedure? After the procedure, it is common to have:  Cramping.  Bleeding. This can vary from light spotting to menstrual-like bleeding. Follow these instructions at home: Activity  Rest for 1-2 days after the procedure.  Do not douche, use tampons, or have sex for 2 weeks after the procedure, or until your health care provider approves.  Do not drive for  24 hours after the procedure, or for as long as told by your health care provider.  Do not drive, use heavy machinery, or drink alcohol while taking prescription pain medicines. Medicines   Take over-the-counter and prescription medicines only as told by your health care provider.  Do not take aspirin during recovery. It can increase the risk of bleeding. General instructions  Do not take baths, swim, or use a hot tub until your health care provider approves. Take showers instead of baths for 2 weeks, or for as long as told by your health care provider.  To prevent or treat constipation while you are taking prescription pain medicine, your health care provider may recommend that you: ? Drink enough fluid to keep your urine clear or pale yellow. ? Take over-the-counter or prescription medicines. ? Eat foods that are high in fiber, such as fresh fruits and vegetables, whole grains, and beans. ? Limit foods that are high in fat and processed sugars, such as fried and sweet foods.  Keep all follow-up visits as told by your health care provider. This is important. Contact a health care provider if:  You feel dizzy or lightheaded.  You feel nauseous.  You have abnormal vaginal discharge.  You have a rash.  You have pain that does not get  better with medicine.  You have chills. Get help right away if:  You have bleeding that is heavier than a normal menstrual period.  You have a fever.  You have pain or cramps that get worse.  You develop new abdominal pain.  You faint.  You have pain in your shoulders.  You have shortness of breath. Summary  After the procedure, you may have cramping and some vaginal bleeding.  Do not douche, use tampons, or have sex for 2 weeks after the procedure, or until your health care provider approves.  Do not take baths, swim, or use a hot tub until your health care provider approves. Take showers instead of baths for 2 weeks, or for as long  as told by your health care provider.  Report any unusual symptoms to your health care provider.  Keep all follow-up visits as told by your health care provider. This is important. This information is not intended to replace advice given to you by your health care provider. Make sure you discuss any questions you have with your health care provider. Document Revised: 09/20/2017 Document Reviewed: 11/06/2016 Elsevier Patient Education  [place].  Dilation and Curettage or Vacuum Curettage, Care After These instructions give you information about caring for yourself after your procedure. Your doctor may also give you more specific instructions. Call your doctor if you have any problems or questions after your procedure. Follow these instructions at home: Activity  Do not drive or use heavy machinery while taking prescription pain medicine.  For 24 hours after your procedure, avoid driving.  Take short walks often, followed by rest periods. Ask your doctor what activities are safe for you. After one or two days, you may be able to return to your normal activities.  Do not lift anything that is heavier than 10 lb (4.5 kg) until your doctor approves.  For at least 2 weeks, or as long as told by your doctor: ? Do not douche. ? Do not use tampons. ? Do not have sex. General instructions   Take over-the-counter and prescription medicines only as told by your doctor. This is very important if you take blood thinning medicine.  Do not take baths, swim, or use a hot tub until your doctor approves. Take showers instead of baths.  Wear compression stockings as told by your doctor.  It is up to you to get the results of your procedure. Ask your doctor when your results will be ready.  Keep all follow-up visits as told by your doctor. This is important. Contact a doctor if:  You have very bad cramps that get worse or do not get better with medicine.  You have very bad pain in  your belly (abdomen).  You cannot drink fluids without throwing up (vomiting).  You get pain in a different part of the area between your belly and thighs (pelvis).  You have bad-smelling discharge from your vagina.  You have a rash. Get help right away if:  You are bleeding a lot from your vagina. A lot of bleeding means soaking more than one sanitary pad in an hour, for 2 hours in a row.  You have clumps of blood (blood clots) coming from your vagina.  You have a fever or chills.  Your belly feels very tender or hard.  You have chest pain.  You have trouble breathing.  You cough up blood.  You feel dizzy.  You feel light-headed.  You pass out (faint).  You have pain  in your neck or shoulder area. Summary  Take short walks often, followed by rest periods. Ask your doctor what activities are safe for you. After one or two days, you may be able to return to your normal activities.  Do not lift anything that is heavier than 10 lb (4.5 kg) until your doctor approves.  Do not take baths, swim, or use a hot tub until your doctor approves. Take showers instead of baths.  Contact your doctor if you have any symptoms of infection, like bad-smelling discharge from your vagina. This information is not intended to replace advice given to you by your health care provider. Make sure you discuss any questions you have with your health care provider. Document Revised: 09/20/2017 Document Reviewed: 06/25/2016 Elsevier Patient Education  2020 [employer]  General Anesthesia, Adult, Care After This sheet gives you information about how to care for yourself after your procedure. Your health care provider may also give you more specific instructions. If you have problems or questions, contact your health care provider. What can I expect after the procedure? After the procedure, the following side effects are common:  Pain or discomfort at the IV site.  Nausea.  Vomiting.  Sore  throat.  Trouble concentrating.  Feeling cold or chills.  Weak or tired.  Sleepiness and fatigue.  Soreness and body aches. These side effects can affect parts of the body that were not involved in surgery. Follow these instructions at home:  For at least 24 hours after the procedure:  Have a responsible adult stay with you. It is important to have someone help care for you until you are awake and alert.  Rest as needed.  Do not: ? Participate in activities in which you could fall or become injured. ? Drive. ? Use heavy machinery. ? Drink alcohol. ? Take sleeping pills or medicines that cause drowsiness. ? Make important decisions or sign legal documents. ? Take care of children on your own. Eating and drinking  Follow any instructions from your health care provider about eating or drinking restrictions.  When you feel hungry, start by eating small amounts of foods that are soft and easy to digest (bland), such as toast. Gradually return to your regular diet.  Drink enough fluid to keep your urine pale yellow.  If you vomit, rehydrate by drinking water, juice, or clear broth. General instructions  If you have sleep apnea, surgery and certain medicines can increase your risk for breathing problems. Follow instructions from your health care provider about wearing your sleep device: ? Anytime you are sleeping, including during daytime naps. ? While taking prescription pain medicines, sleeping medicines, or medicines that make you drowsy.  Return to your normal activities as told by your health care provider. Ask your health care provider what activities are safe for you.  Take over-the-counter and prescription medicines only as told by your health care provider.  If you smoke, do not smoke without supervision.  Keep all follow-up visits as told by your health care provider. This is important. Contact a health care provider if:  You have nausea or vomiting that does not  get better with medicine.  You cannot eat or drink without vomiting.  You have pain that does not get better with medicine.  You are unable to pass urine.  You develop a skin rash.  You have a fever.  You have redness around your IV site that gets worse. Get help right away if:  You have difficulty breathing.  You have chest pain.  You have blood in your urine or stool, or you vomit blood. Summary  After the procedure, it is common to have a sore throat or nausea. It is also common to feel tired.  Have a responsible adult stay with you for the first 24 hours after general anesthesia. It is important to have someone help care for you until you are awake and alert.  When you feel hungry, start by eating small amounts of foods that are soft and easy to digest (bland), such as toast. Gradually return to your regular diet.  Drink enough fluid to keep your urine pale yellow.  Return to your normal activities as told by your health care provider. Ask your health care provider what activities are safe for you. This information is not intended to replace advice given to you by your health care provider. Make sure you discuss any questions you have with your health care provider. Document Revised: 10/11/2017 Document Reviewed: 05/24/2017 Elsevier Patient Education  2020 [employer]. How to Use Chlorhexidine for Bathing Chlorhexidine gluconate (CHG) is a germ-killing (antiseptic) solution that is used to clean the skin. It can get rid of the bacteria that normally live on the skin and can keep them away for about 24 hours. To clean your skin with CHG, you may be given:  A CHG solution to use in the shower or as part of a sponge bath.  A prepackaged cloth that contains CHG. Cleaning your skin with CHG may help lower the risk for infection:  While you are staying in the intensive care unit of the hospital.  If you have a vascular access, such as a central line, to provide short-term  or long-term access to your veins.  If you have a catheter to drain urine from your bladder.  If you are on a ventilator. A ventilator is a machine that helps you breathe by moving air in and out of your lungs.  After surgery. What are the risks? Risks of using CHG include:  A skin reaction.  Hearing loss, if CHG gets in your ears.  Eye injury, if CHG gets in your eyes and is not rinsed out.  The CHG product catching fire. Make sure that you avoid smoking and flames after applying CHG to your skin. Do not use CHG:  If you have a chlorhexidine allergy or have previously reacted to chlorhexidine.  On babies younger than 40 months of age. How to use CHG solution  Use CHG only as told by your health care provider, and follow the instructions on the label.  Use the full amount of CHG as directed. Usually, this is one bottle. During a shower Follow these steps when using CHG solution during a shower (unless your health care provider gives you different instructions): 1. Start the shower. 2. Use your normal soap and shampoo to wash your face and hair. 3. Turn off the shower or move out of the shower stream. 4. Pour the CHG onto a clean washcloth. Do not use any type of brush or rough-edged sponge. 5. Starting at your neck, lather your body down to your toes. Make sure you follow these instructions: ? If you will be having surgery, pay special attention to the part of your body where you will be having surgery. Scrub this area for at least 1 minute. ? Do not use CHG on your head or face. If the solution gets into your ears or eyes, rinse them well with water. ?  Avoid your genital area. ? Avoid any areas of skin that have broken skin, cuts, or scrapes. ? Scrub your back and under your arms. Make sure to wash skin folds. 6. Let the lather sit on your skin for 1-2 minutes or as long as told by your health care provider. 7. Thoroughly rinse your entire body in the shower. Make sure that  all body creases and crevices are rinsed well. 8. Dry off with a clean towel. Do not put any substances on your body afterward--such as powder, lotion, or perfume--unless you are told to do so by your health care provider. Only use lotions that are recommended by the manufacturer. 9. Put on clean clothes or pajamas. 10. If it is the night before your surgery, sleep in clean sheets.  During a sponge bath Follow these steps when using CHG solution during a sponge bath (unless your health care provider gives you different instructions): 1. Use your normal soap and shampoo to wash your face and hair. 2. Pour the CHG onto a clean washcloth. 3. Starting at your neck, lather your body down to your toes. Make sure you follow these instructions: ? If you will be having surgery, pay special attention to the part of your body where you will be having surgery. Scrub this area for at least 1 minute. ? Do not use CHG on your head or face. If the solution gets into your ears or eyes, rinse them well with water. ? Avoid your genital area. ? Avoid any areas of skin that have broken skin, cuts, or scrapes. ? Scrub your back and under your arms. Make sure to wash skin folds. 4. Let the lather sit on your skin for 1-2 minutes or as long as told by your health care provider. 5. Using a different clean, wet washcloth, thoroughly rinse your entire body. Make sure that all body creases and crevices are rinsed well. 6. Dry off with a clean towel. Do not put any substances on your body afterward--such as powder, lotion, or perfume--unless you are told to do so by your health care provider. Only use lotions that are recommended by the manufacturer. 7. Put on clean clothes or pajamas. 8. If it is the night before your surgery, sleep in clean sheets. How to use CHG prepackaged cloths  Only use CHG cloths as told by your health care provider, and follow the instructions on the label.  Use the CHG cloth on clean, dry  skin.  Do not use the CHG cloth on your head or face unless your health care provider tells you to.  When washing with the CHG cloth: ? Avoid your genital area. ? Avoid any areas of skin that have broken skin, cuts, or scrapes. Before surgery Follow these steps when using a CHG cloth to clean before surgery (unless your health care provider gives you different instructions): 1. Using the CHG cloth, vigorously scrub the part of your body where you will be having surgery. Scrub using a back-and-forth motion for 3 minutes. The area on your body should be completely wet with CHG when you are done scrubbing. 2. Do not rinse. Discard the cloth and let the area air-dry. Do not put any substances on the area afterward, such as powder, lotion, or perfume. 3. Put on clean clothes or pajamas. 4. If it is the night before your surgery, sleep in clean sheets.  For general bathing Follow these steps when using CHG cloths for general bathing (unless your health care provider  gives you different instructions). 1. Use a separate CHG cloth for each area of your body. Make sure you wash between any folds of skin and between your fingers and toes. Wash your body in the following order, switching to a new cloth after each step: ? The front of your neck, shoulders, and chest. ? Both of your arms, under your arms, and your hands. ? Your stomach and groin area, avoiding the genitals. ? Your right leg and foot. ? Your left leg and foot. ? The back of your neck, your back, and your buttocks. 2. Do not rinse. Discard the cloth and let the area air-dry. Do not put any substances on your body afterward--such as powder, lotion, or perfume--unless you are told to do so by your health care provider. Only use lotions that are recommended by the manufacturer. 3. Put on clean clothes or pajamas. Contact a health care provider if:  Your skin gets irritated after scrubbing.  You have questions about using your solution or  cloth. Get help right away if:  Your eyes become very red or swollen.  Your eyes itch badly.  Your skin itches badly and is red or swollen.  Your hearing changes.  You have trouble seeing.  You have swelling or tingling in your mouth or throat.  You have trouble breathing.  You swallow any chlorhexidine. Summary  Chlorhexidine gluconate (CHG) is a germ-killing (antiseptic) solution that is used to clean the skin. Cleaning your skin with CHG may help to lower your risk for infection.  You may be given CHG to use for bathing. It may be in a bottle or in a prepackaged cloth to use on your skin. Carefully follow your health care provider's instructions and the instructions on the product label.  Do not use CHG if you have a chlorhexidine allergy.  Contact your health care provider if your skin gets irritated after scrubbing. This information is not intended to replace advice given to you by your health care provider. Make sure you discuss any questions you have with your health care provider. Document Revised: 12/25/2018 Document Reviewed: 09/05/2017 Elsevier Patient Education  2020 [employer].

## 2020-01-19 ENCOUNTER — Other Ambulatory Visit: Payer: Self-pay | Admitting: Obstetrics and Gynecology

## 2020-01-20 ENCOUNTER — Encounter (HOSPITAL_COMMUNITY): Payer: Self-pay

## 2020-01-20 ENCOUNTER — Encounter (HOSPITAL_COMMUNITY)
Admission: RE | Admit: 2020-01-20 | Discharge: 2020-01-20 | Disposition: A | Discharge status: Home / Self Care | Payer: BC Managed Care – PPO | Source: Ambulatory Visit | Attending: Obstetrics and Gynecology | Admitting: Obstetrics and Gynecology

## 2020-01-20 ENCOUNTER — Other Ambulatory Visit: Payer: Self-pay

## 2020-01-20 DIAGNOSIS — Z01812 Encounter for preprocedural laboratory examination: Secondary | ICD-10-CM | POA: Insufficient documentation

## 2020-01-20 LAB — COMPREHENSIVE METABOLIC PANEL
ALT: 19 U/L (ref 0–44)
AST: 20 U/L (ref 15–41)
Albumin: 3.6 g/dL (ref 3.5–5.0)
Alkaline Phosphatase: 71 U/L (ref 38–126)
Anion gap: 10 (ref 5–15)
BUN: 11 mg/dL (ref 6–20)
CO2: 26 mmol/L (ref 22–32)
Calcium: 9.7 mg/dL (ref 8.9–10.3)
Chloride: 104 mmol/L (ref 98–111)
Creatinine, Ser: 0.58 mg/dL (ref 0.44–1.00)
GFR calc Af Amer: >60 mL/min (ref >60)
GFR calc non Af Amer: >60 mL/min (ref >60)
Glucose, Bld: 88 mg/dL (ref 70–99)
Potassium: 3.9 mmol/L (ref 3.5–5.1)
Sodium: 140 mmol/L (ref 135–145)
Total Bilirubin: 0.7 mg/dL (ref 0.3–1.2)
Total Protein: 6.8 g/dL (ref 6.5–8.1)

## 2020-01-20 LAB — URINALYSIS, ROUTINE W REFLEX MICROSCOPIC
Bilirubin Urine: NEGATIVE
Glucose, UA: NEGATIVE mg/dL
Ketones, ur: NEGATIVE mg/dL
Leukocytes,Ua: NEGATIVE
Nitrite: NEGATIVE
Protein, ur: NEGATIVE mg/dL
Specific Gravity, Urine: 1.011 (ref 1.005–1.030)
pH: 7 (ref 5.0–8.0)

## 2020-01-20 LAB — TYPE AND SCREEN
ABO/RH(D): B POS
Antibody Screen: NEGATIVE

## 2020-01-20 LAB — CBC
HCT: 42.8 % (ref 36.0–46.0)
Hemoglobin: 14.2 g/dL (ref 12.0–15.0)
MCH: 28.8 pg (ref 26.0–34.0)
MCHC: 33.2 g/dL (ref 30.0–36.0)
MCV: 86.8 fL (ref 80.0–100.0)
Platelets: 235 10*3/uL (ref 150–400)
RBC: 4.93 MIL/uL (ref 3.87–5.11)
RDW: 13.1 % (ref 11.5–15.5)
WBC: 4.7 10*3/uL (ref 4.0–10.5)
nRBC: 0 % (ref 0.0–0.2)

## 2020-01-25 ENCOUNTER — Other Ambulatory Visit: Payer: Self-pay

## 2020-01-25 ENCOUNTER — Other Ambulatory Visit (HOSPITAL_COMMUNITY)
Admission: RE | Admit: 2020-01-25 | Discharge: 2020-01-25 | Disposition: A | Discharge status: Home / Self Care | Payer: BC Managed Care – PPO | Source: Ambulatory Visit | Attending: Obstetrics and Gynecology | Admitting: Obstetrics and Gynecology

## 2020-01-25 DIAGNOSIS — Z20822 Contact with and (suspected) exposure to covid-19: Secondary | ICD-10-CM | POA: Insufficient documentation

## 2020-01-25 DIAGNOSIS — Z01812 Encounter for preprocedural laboratory examination: Secondary | ICD-10-CM | POA: Insufficient documentation

## 2020-01-25 LAB — SARS CORONAVIRUS 2 (TAT 6-24 HRS): SARS Coronavirus 2: NEGATIVE

## 2020-01-25 NOTE — H&P (Signed)
Preoperative History and Physical  Alyssa Romero is a 59 y.o. G0P0000 here for surgical management of Endometrial polyp. No significant preoperative concerns.  Proposed surgery: D&C w/ hysteroscopy and endometrial polypectomy.      Past Medical History:  Diagnosis Date  . Anemia   . DDD (degenerative disc disease), cervical   . DJD of shoulder    left  . GERD (gastroesophageal reflux disease)   . Hiatal hernia   . Muscle spasm    "she has recurrent muscle spasms all over for as long as I've known her" per husband  . Pain in right shoulder   . Pneumonia   . PONV (postoperative nausea and vomiting)    sts "phenergan does not work".  . Sleep apnea    tested positive but did not obtain CPAP        Past Surgical History:  Procedure Laterality Date  . ESOPHAGOGASTRODUODENOSCOPY  09/27/10   JSE:GBTDVV without barrett mass or structures/large HH/she was dilated to 16 mm. gastritis on path.  . FERTILITY SURGERY    . KNEE ARTHROSCOPY WITH LATERAL MENISECTOMY Left 06/06/2017   Procedure: ARTHROSCOPY LEFT KNEE WITH PARTIAL LATERAL MENISECTOMY;  Surgeon: Vickki Hearing, MD;  Location: [facility];  Service: Orthopedics;  Laterality: Left;  . lymphectomy     age 25;left neck  . SHOULDER SURGERY Right    rotator cuff and bone spurs removed          OB History  Gravida Para Term Preterm AB Living  0 0 0 0 0 0  SAB TAB Ectopic Multiple Live Births   0 0 0 0 0   Patient denies any other pertinent gynecologic issues.   No current outpatient medications on file prior to visit.   No current facility-administered medications on file prior to visit.        Allergies  Allergen Reactions  . Statins Hives  . Azithromycin     SEVERE ABDOMINAL CRAMPINP AND ITCHING  . Decadron [Dexamethasone]     Decrease in blood pressure  . Sulfonamide Derivatives Other (See Comments)    Reaction unknown--adolescent allergy    Social History:   reports  that she quit smoking about 27 years ago. Her smoking use included cigarettes. She has a 25.00 pack-year smoking history. She has never used smokeless tobacco. She reports current alcohol use. She reports that she does not use drugs.  Family History  Adopted: Yes    Review of Systems: Noncontributory  PHYSICAL EXAM: Blood pressure 137/90, pulse 82, height 5\' 7"  (1.702 m), weight 274 lb 12.8 oz (124.6 kg). General appearance - alert, well appearing, and in no distress Chest - clear to auscultation, no wheezes, rales or rhonchi, symmetric air entry Heart - normal rate and regular rhythm Abdomen - soft, nontender, nondistended, no masses or organomegaly Pelvic -  VAGINA: normal appearing vagina  CERVIX: slightly deviated to the left, small, well supported, some spotting after exam UTERUS: uterus is small in size, and nontender, retroverted, well supported GCCHL collected  Extremities - peripheral pulses normal, no pedal edema, no clubbing or cyanosis  Labs: CBC    Component Value Date/Time   WBC 4.7 01/20/2020 0929   RBC 4.93 01/20/2020 0929   HGB 14.2 01/20/2020 0929   HCT 42.8 01/20/2020 0929   PLT 235 01/20/2020 0929   MCV 86.8 01/20/2020 0929   MCH 28.8 01/20/2020 0929   MCHC 33.2 01/20/2020 0929   RDW 13.1 01/20/2020 0929    CMP  Component Value Date/Time   NA 140 01/20/2020 0929   K 3.9 01/20/2020 0929   CL 104 01/20/2020 0929   CO2 26 01/20/2020 0929   GLUCOSE 88 01/20/2020 0929   BUN 11 01/20/2020 0929   CREATININE 0.58 01/20/2020 0929   CALCIUM 9.7 01/20/2020 0929   PROT 6.8 01/20/2020 0929   ALBUMIN 3.6 01/20/2020 0929   AST 20 01/20/2020 0929   ALT 19 01/20/2020 0929   ALKPHOS 71 01/20/2020 0929   BILITOT 0.7 01/20/2020 0929   GFRNONAA >60 01/20/2020 0929   GFRAA >60 01/20/2020 0929    Imaging Studies:  Imaging Results  US Transvaginal Non-OB  Result Date: 12/24/2019 GYNECOLOGIC SONOGRAM Alyssa Romero is a 59 y.o. G0P0000 No LMP  recorded. Patient is postmenopausal.She is here for a pelvic sonogram for postmenopausal bleeding. Uterus                      7.25 x 3.9 x 4.2 cm, Total uterine volume 63 cc,heterogeneous retroverted uterus with mult linear striations(? adenomyosis),mult simple nabothian cyst Endometrium          17 mm, symmetrical, thickened,heterogeneous,vascular endometrium with mult cystic areas Right ovary             2 x 2.6 x 1.3 cm, wnl Left ovary                1.9 x 1.5 x 2.3 cm, wnl,unable to slide left ovary No free fluid Technician Comments: PELVIC US TA/TV:heterogeneous retroverted uterus with mult linear striations(? adenomyosis),mult simple nabothian cyst,thickened,heterogeneous,vascular endometrium with mult cystic areas,EEC 17 mm,normal ovaries,unable to slide left ovary,no free fluid,no pain during ultrasound Ch[employer] 12/23/2019 1:18 PM Clinical Impression and recommendations: I have reviewed the sonogram results above, combined with the patient's current clinical course, below are my impressions and any appropriate recommendations for management based on the sonographic findings. Uterus is normal size shape and contour, evidence of adenomyosis, clinically insignificant Endometrium is complex and thickened and will require formal pathologic evaluation Ovaries are normal [email or web address] Lazaro Arms 12/24/2019 7:03 AM   US PELVIS (TRANSABDOMINAL ONLY)  Result Date: 12/24/2019 GYNECOLOGIC SONOGRAM Alyssa Romero is a 59 y.o. G0P0000 No LMP recorded. Patient is postmenopausal.She is here for a pelvic sonogram for postmenopausal bleeding. Uterus                      7.25 x 3.9 x 4.2 cm, Total uterine volume 63 cc,heterogeneous retroverted uterus with mult linear striations(? adenomyosis),mult simple nabothian cyst Endometrium          17 mm, symmetrical, thickened,heterogeneous,vascular endometrium with mult cystic areas Right ovary             2 x  2.6 x 1.3 cm, wnl Left ovary                1.9 x 1.5 x 2.3 cm, wnl,unable to slide left ovary No free fluid Technician Comments: PELVIC US TA/TV:heterogeneous retroverted uterus with mult linear striations(? adenomyosis),mult simple nabothian cyst,thickened,heterogeneous,vascular endometrium with mult cystic areas,EEC 17 mm,normal ovaries,unable to slide left ovary,no free fluid,no pain during ultrasound [employer] 12/23/2019 1:18 PM Clinical Impression and recommendations: I have reviewed the sonogram results above, combined with the patient's current clinical course, below are my impressions and any appropriate recommendations for management based on the sonographic findings. Uterus is normal size shape and contour, evidence of adenomyosis, clinically insignificant Endometrium is complex and  thickened and will require formal pathologic evaluation Ovaries are normal [email or web address] Florian Buff 12/24/2019 7:03 AM     Assessment:     Patient Active Problem List   Diagnosis Date Noted  . Tear of lateral meniscus of left knee, current   . Left anterior cruciate ligament tear   . Esophageal dysphagia 09/27/2014  . Nausea with vomiting 09/27/2014  . Hiatal hernia 09/27/2014  . Lumbar spondylosis with myelopathy 02/04/2014  . RUPTURE ROTATOR CUFF 12/20/2009  . SHOULDER, ARTHRITIS, DEGEN./OSTEO 12/08/2009    Plan: Patient will undergo surgical management with D&C w/ hysteroscopy and endometrial polypectomy 01/26/2020. All questions answered. Prefers spinal due to medical issues with anesthesia.  Increase fluid and water intake.

## 2020-01-26 ENCOUNTER — Telehealth: Payer: Self-pay | Admitting: Obstetrics and Gynecology

## 2020-01-26 ENCOUNTER — Ambulatory Visit (HOSPITAL_COMMUNITY): Payer: BC Managed Care – PPO | Admitting: Anesthesiology

## 2020-01-26 ENCOUNTER — Encounter (HOSPITAL_COMMUNITY): Payer: Self-pay | Admitting: Obstetrics and Gynecology

## 2020-01-26 ENCOUNTER — Encounter (HOSPITAL_COMMUNITY)
Admission: RE | Disposition: A | Discharge status: Home / Self Care | Payer: Self-pay | Source: Home / Self Care | Attending: Obstetrics and Gynecology

## 2020-01-26 ENCOUNTER — Ambulatory Visit (HOSPITAL_COMMUNITY)
Admission: RE | Admit: 2020-01-26 | Discharge: 2020-01-26 | Disposition: A | Discharge status: Home / Self Care | Payer: BC Managed Care – PPO | Attending: Obstetrics and Gynecology | Admitting: Obstetrics and Gynecology

## 2020-01-26 DIAGNOSIS — Z881 Allergy status to other antibiotic agents status: Secondary | ICD-10-CM | POA: Insufficient documentation

## 2020-01-26 DIAGNOSIS — G4730 Sleep apnea, unspecified: Secondary | ICD-10-CM | POA: Insufficient documentation

## 2020-01-26 DIAGNOSIS — Z882 Allergy status to sulfonamides status: Secondary | ICD-10-CM | POA: Diagnosis not present

## 2020-01-26 DIAGNOSIS — N950 Postmenopausal bleeding: Secondary | ICD-10-CM | POA: Diagnosis present

## 2020-01-26 DIAGNOSIS — Z87891 Personal history of nicotine dependence: Secondary | ICD-10-CM | POA: Diagnosis not present

## 2020-01-26 DIAGNOSIS — N840 Polyp of corpus uteri: Secondary | ICD-10-CM

## 2020-01-26 DIAGNOSIS — N939 Abnormal uterine and vaginal bleeding, unspecified: Secondary | ICD-10-CM

## 2020-01-26 DIAGNOSIS — M1990 Unspecified osteoarthritis, unspecified site: Secondary | ICD-10-CM | POA: Diagnosis not present

## 2020-01-26 DIAGNOSIS — Z888 Allergy status to other drugs, medicaments and biological substances status: Secondary | ICD-10-CM | POA: Diagnosis not present

## 2020-01-26 HISTORY — PX: HYSTEROSCOPY WITH D & C: SHX1775

## 2020-01-26 HISTORY — PX: POLYPECTOMY: SHX5525

## 2020-01-26 SURGERY — DILATATION AND CURETTAGE /HYSTEROSCOPY
Anesthesia: Monitor Anesthesia Care

## 2020-01-26 MED ORDER — PHENYLEPHRINE HCL (PRESSORS) 10 MG/ML IV SOLN
INTRAVENOUS | Status: DC | PRN
  Administered 2020-01-26 (×2): 100 ug via INTRAVENOUS

## 2020-01-26 MED ORDER — PROPOFOL 500 MG/50ML IV EMUL
INTRAVENOUS | Status: DC | PRN
  Administered 2020-01-26: 25 ug/kg/min via INTRAVENOUS

## 2020-01-26 MED ORDER — LACTATED RINGERS IV SOLN: INTRAVENOUS | Status: DC | PRN

## 2020-01-26 MED ORDER — MIDAZOLAM HCL 2 MG/2ML IJ SOLN
INTRAMUSCULAR | Status: AC
  Filled 2020-01-26: qty 2

## 2020-01-26 MED ORDER — ACETAMINOPHEN 500 MG PO TABS
1000.0000 mg | ORAL_TABLET | Freq: Once | ORAL | Status: AC
  Administered 2020-01-26: 1000 mg via ORAL

## 2020-01-26 MED ORDER — SCOPOLAMINE 1 MG/3DAYS TD PT72
1.0000 | MEDICATED_PATCH | Freq: Once | TRANSDERMAL | Status: DC
  Administered 2020-01-26: 1.5 mg via TRANSDERMAL

## 2020-01-26 MED ORDER — SODIUM CHLORIDE 0.9 % IR SOLN
Status: DC | PRN
  Administered 2020-01-26: 1000 mL

## 2020-01-26 MED ORDER — PROMETHAZINE HCL 25 MG/ML IJ SOLN: 6.2500 mg | INTRAMUSCULAR | Status: DC | PRN

## 2020-01-26 MED ORDER — KETOROLAC TROMETHAMINE 30 MG/ML IJ SOLN
30.0000 mg | Freq: Once | INTRAMUSCULAR | Status: AC
  Administered 2020-01-26: 30 mg via INTRAVENOUS

## 2020-01-26 MED ORDER — ONDANSETRON HCL 4 MG/2ML IJ SOLN
INTRAMUSCULAR | Status: DC | PRN
  Administered 2020-01-26: 4 mg via INTRAVENOUS

## 2020-01-26 MED ORDER — CEFAZOLIN SODIUM-DEXTROSE 2-4 GM/100ML-% IV SOLN
2.0000 g | INTRAVENOUS | Status: AC
  Administered 2020-01-26: 2 g via INTRAVENOUS
  Filled 2020-01-26: qty 100

## 2020-01-26 MED ORDER — SCOPOLAMINE 1 MG/3DAYS TD PT72
MEDICATED_PATCH | TRANSDERMAL | Status: AC
  Filled 2020-01-26: qty 1

## 2020-01-26 MED ORDER — FENTANYL CITRATE (PF) 100 MCG/2ML IJ SOLN
INTRAMUSCULAR | Status: DC | PRN
  Administered 2020-01-26 (×2): 50 ug via INTRAVENOUS

## 2020-01-26 MED ORDER — MIDAZOLAM HCL 5 MG/5ML IJ SOLN
INTRAMUSCULAR | Status: DC | PRN
  Administered 2020-01-26: 2 mg via INTRAVENOUS

## 2020-01-26 MED ORDER — ACETAMINOPHEN 500 MG PO TABS
ORAL_TABLET | ORAL | Status: AC
  Filled 2020-01-26: qty 2

## 2020-01-26 MED ORDER — ONDANSETRON HCL 4 MG/2ML IJ SOLN
INTRAMUSCULAR | Status: AC
  Filled 2020-01-26: qty 2

## 2020-01-26 MED ORDER — METOCLOPRAMIDE HCL 5 MG/ML IJ SOLN
10.0000 mg | Freq: Once | INTRAMUSCULAR | Status: AC
  Administered 2020-01-26: 10 mg via INTRAVENOUS

## 2020-01-26 MED ORDER — CEFAZOLIN SODIUM-DEXTROSE 2-4 GM/100ML-% IV SOLN
INTRAVENOUS | Status: AC
  Filled 2020-01-26: qty 100

## 2020-01-26 MED ORDER — KETOROLAC TROMETHAMINE 30 MG/ML IJ SOLN
INTRAMUSCULAR | Status: AC
  Filled 2020-01-26: qty 1

## 2020-01-26 MED ORDER — BUPIVACAINE-EPINEPHRINE (PF) 0.5% -1:200000 IJ SOLN
INTRAMUSCULAR | Status: AC
  Filled 2020-01-26: qty 30

## 2020-01-26 MED ORDER — BUPIVACAINE IN DEXTROSE 0.75-8.25 % IT SOLN
INTRATHECAL | Status: DC | PRN
  Administered 2020-01-26: 1.4 mL via INTRATHECAL

## 2020-01-26 MED ORDER — FENTANYL CITRATE (PF) 100 MCG/2ML IJ SOLN
INTRAMUSCULAR | Status: AC
  Filled 2020-01-26: qty 2

## 2020-01-26 MED ORDER — MIDAZOLAM HCL 2 MG/2ML IJ SOLN
2.0000 mg | Freq: Once | INTRAMUSCULAR | Status: AC
  Administered 2020-01-26: 08:00:00 2 mg via INTRAVENOUS

## 2020-01-26 MED ORDER — HYDROMORPHONE HCL 1 MG/ML IJ SOLN
0.2500 mg | INTRAMUSCULAR | Status: DC | PRN
  Administered 2020-01-26: 0.5 mg via INTRAVENOUS
  Filled 2020-01-26: qty 0.5

## 2020-01-26 MED ORDER — LACTATED RINGERS IV SOLN: Freq: Once | INTRAVENOUS | Status: AC

## 2020-01-26 MED ORDER — MEPERIDINE HCL 50 MG/ML IJ SOLN: 6.2500 mg | INTRAMUSCULAR | Status: DC | PRN

## 2020-01-26 MED ORDER — SODIUM CHLORIDE 0.9 % IR SOLN
Status: DC | PRN
  Administered 2020-01-26: 3000 mL

## 2020-01-26 MED ORDER — PROPOFOL 10 MG/ML IV BOLUS
INTRAVENOUS | Status: DC | PRN
  Administered 2020-01-26: 50 mg via INTRAVENOUS

## 2020-01-26 MED ORDER — METOCLOPRAMIDE HCL 5 MG/ML IJ SOLN
INTRAMUSCULAR | Status: AC
  Filled 2020-01-26: qty 2

## 2020-01-26 MED ORDER — PROPOFOL 10 MG/ML IV BOLUS
INTRAVENOUS | Status: AC
  Filled 2020-01-26: qty 20

## 2020-01-26 SURGICAL SUPPLY — 28 items
CLOTH BEACON ORANGE TIMEOUT ST (SAFETY) ×2 IMPLANT
COVER LIGHT HANDLE STERIS (MISCELLANEOUS) ×4 IMPLANT
COVER WAND RF STERILE (DRAPES) ×2 IMPLANT
DECANTER SPIKE VIAL GLASS SM (MISCELLANEOUS) ×2 IMPLANT
ELECT REM PT RETURN 9FT ADLT (ELECTROSURGICAL) ×2
ELECTRODE REM PT RTRN 9FT ADLT (ELECTROSURGICAL) IMPLANT
GAUZE 4X4 16PLY RFD (DISPOSABLE) ×2 IMPLANT
GLOVE BIO SURGEON STRL SZ7 (GLOVE) ×1 IMPLANT
GLOVE BIOGEL PI IND STRL 7.0 (GLOVE) ×2 IMPLANT
GLOVE BIOGEL PI IND STRL 9 (GLOVE) ×1 IMPLANT
GLOVE BIOGEL PI INDICATOR 7.0 (GLOVE) ×2
GLOVE BIOGEL PI INDICATOR 9 (GLOVE) ×1
GLOVE ECLIPSE 9.0 STRL (GLOVE) ×2 IMPLANT
GOWN SPEC L3 XXLG W/TWL (GOWN DISPOSABLE) ×2 IMPLANT
GOWN STRL REUS W/TWL LRG LVL3 (GOWN DISPOSABLE) ×2 IMPLANT
INST SET HYSTEROSCOPY (KITS) ×2 IMPLANT
IV NS IRRIG 3000ML ARTHROMATIC (IV SOLUTION) ×2 IMPLANT
KIT TURNOVER KIT A (KITS) ×2 IMPLANT
MANIFOLD NEPTUNE II (INSTRUMENTS) ×2 IMPLANT
NDL HYPO 25X1 1.5 SAFETY (NEEDLE) IMPLANT
NEEDLE HYPO 25X1 1.5 SAFETY (NEEDLE) ×2 IMPLANT
NS IRRIG 1000ML POUR BTL (IV SOLUTION) ×2 IMPLANT
PACK PERI GYN (CUSTOM PROCEDURE TRAY) ×2 IMPLANT
PAD ARMBOARD 7.5X6 YLW CONV (MISCELLANEOUS) ×2 IMPLANT
PAD TELFA 3X4 1S STER (GAUZE/BANDAGES/DRESSINGS) ×2 IMPLANT
SET BASIN LINEN APH (SET/KITS/TRAYS/PACK) ×2 IMPLANT
SET CYSTO W/LG BORE CLAMP LF (SET/KITS/TRAYS/PACK) ×2 IMPLANT
SYR CONTROL 10ML LL (SYRINGE) ×2 IMPLANT

## 2020-01-26 NOTE — Discharge Instructions (Signed)
Spinal Anesthesia and Epidural Anesthesia, Care After This sheet gives you information about how to care for yourself after your procedure. Your doctor may also give you more specific instructions. If you have problems or questions, call your doctor. Follow these instructions at home: For at least 24 hours after the procedure:   Have a responsible adult stay with you. It is important to have someone help care for you until you are awake and alert.  Rest as needed.  Do not do activities where you could fall or get hurt (injured).  Do not drive.  Do not use heavy machinery.  Do not drink alcohol.  Do not take sleeping pills or medicines that make you sleepy (drowsy).  Do not make important decisions.  Do not sign legal documents.  Do not take care of children on your own. Eating and drinking  If you throw up (vomit), drink water, juice, or soup when nausea and vomiting stop.  Drink enough fluid to keep your pee (urine) pale yellow.  Make sure you do not feel like throwing up (nauseous) before you eat solid foods.  Follow the diet that your doctor recommends. General instructions  Return to your normal activities as told by your doctor. Ask your doctor what activities are safe for you.  Take over-the-counter and prescription medicines only as told by your doctor.  If you have sleep apnea, surgery and certain medicines can raise your risk for breathing problems. Follow instructions from your doctor about when to wear your sleep device. Your doctor may tell you to wear your sleep device: ? Anytime you are sleeping, including during daytime naps. ? While taking prescription pain medicines, sleeping pills, or medicines that make you sleepy.  Do not use any products that contain nicotine or tobacco. This includes cigarettes and e-cigarettes. ? If you need help quitting, ask your doctor. ? If you smoke, do not smoke by yourself. Make sure someone is nearby in case you need  help.  Keep all follow-up visits as told by your doctor. This is important. Contact a doctor if:  It has been more than one day since your procedure and you feel like throwing up.  It has been more than one day since your procedure and you throw up.  You have a rash. Get help right away if:  You have a fever.  You have a headache that lasts a long time.  You have a very bad headache.  Your vision is blurry.  You see two of a single object (double vision).  You are dizzy or light-headed.  You faint.  Your arms or legs tingle, feel weak, or get numb.  You have trouble breathing.  You cannot pee (urinate). Summary  After the procedure, have a responsible adult stay with you at home until you are fully awake and alert.  Do not do activities that might get you injured. Do not drive, use heavy machinery, drink alcohol, or make important decisions for 24 hours after the procedure.  Take medicines as told by your doctor. Do not use products that contain nicotine or tobacco.  Get help right away if you have a fever, blurry vision, difficulty breathing or passing urine, or weakness or numbness in arms or legs. This information is not intended to replace advice given to you by your health care provider. Make sure you discuss any questions you have with your health care provider. Document Revised: 09/20/2017 Document Reviewed: 01/30/2016 Elsevier Patient Education  2020 Elsevier Inc.    01/30/2016 Elsevier Patient Education  2020 [employer].

## 2020-01-26 NOTE — Anesthesia Preprocedure Evaluation (Signed)
Anesthesia Evaluation  Patient identified by MRN, date of birth, ID band Patient awake    Reviewed: Allergy & Precautions, NPO status , Patient's Chart, lab work & pertinent test results  History of Anesthesia Complications (+) PONV and history of anesthetic complications  Airway Mallampati: II  TM Distance: >3 FB Neck ROM: Full    Dental  (+) Teeth Intact, Dental Advisory Given   Pulmonary sleep apnea , pneumonia, resolved, former smoker,    Pulmonary exam normal breath sounds clear to auscultation       Cardiovascular Exercise Tolerance: Good  Rhythm:Regular Rate:Normal - Systolic murmurs, - Diastolic murmurs, - Friction Rub, - Carotid Bruit, - Peripheral Edema and - Systolic Click    Neuro/Psych negative neurological ROS  negative psych ROS   GI/Hepatic hiatal hernia (large hiatal hernia, can't lie down flat), GERD  Medicated and Controlled,  Endo/Other  negative endocrine ROS  Renal/GU   negative genitourinary   Musculoskeletal  (+) Arthritis ,   Abdominal   Peds negative pediatric ROS (+)  Hematology  (+) anemia ,   Anesthesia Other Findings   Reproductive/Obstetrics                            Anesthesia Physical Anesthesia Plan  ASA: III  Anesthesia Plan: Spinal and MAC   Post-op Pain Management:    Induction:   PONV Risk Score and Plan: 4 or greater and Ondansetron, Midazolam, Scopolamine patch - Pre-op and Metaclopromide  Airway Management Planned: Nasal Cannula and Natural Airway  Additional Equipment:   Intra-op Plan:   Post-operative Plan: Extubation in OR  Informed Consent:     Dental advisory given  Plan Discussed with: CRNA  Anesthesia Plan Comments: (H/o severe PONV, Large hiatal hernia, can't lie down flat. Patient agreed to get spinal with minimal sedation. Will keep her head up during the procedure.)       Anesthesia Quick Evaluation

## 2020-01-26 NOTE — Op Note (Signed)
01/26/2020  9:37 AM  PATIENT:  Alyssa Romero  59 y.o. female  PRE-OPERATIVE DIAGNOSIS:  ENDOMETRIAL  POLYP, postmenopausal bleeding due to endometrial polyp  POST-OPERATIVE DIAGNOSIS:  ENDOMETRIAL  POLYP same  PROCEDURE:  Procedure(s): DILATATION AND CURETTAGE /HYSTEROSCOPY (N/A) ENDOMETRIAL POLYPECTOMY (N/A)  SURGEON:  Surgeon(s) and Role:    * Dairon Procter V, MD - Primary  PHYSICIAN ASSISTANT:   ASSISTANTS: none   ANESTHESIA:   spinal  EBL: Minimal to 5 cc  BLOOD ADMINISTERED:none  DRAINS: none   LOCAL MEDICATIONS USED:  NONE  SPECIMEN:  Source of Specimen:  Endometrial polyp, endometrial curettings  DISPOSITION OF SPECIMEN:  PATHOLOGY  COUNTS:  YES  TOURNIQUET:  * No tourniquets in log *  DICTATION: .Dragon Dictation  PLAN OF CARE: Discharge to home after PACU  PATIENT DISPOSITION:  PACU - hemodynamically stable.   Delay start of Pharmacological VTE agent (>24hrs) due to surgical blood loss or risk of bleeding: not applicable   Details of procedure: Patient was taken the operating room prepped and draped with legs in high lithotomy support after spinal anesthesia was confirmed as being very effective. Timeout was conducted and procedure confirmed by operative team. Speculum was inserted cervix grasped with cervix tenaculum, sounded in the mid position to 8 cm, dilated to 23 [place] allowing introduction of the rigid 30 degree operative hysteroscope without difficulty identifying several endometrial polyps the largest of which originated from the left posterior fundal portion of the uterus.  The hysteroscopic biopsy device was used to amputate the majority of the stalk off of the largest polyp and Randall stone forceps used to probe the endometrial cavity and extract and pull off the polyps.  Repeat hysteroscopy revealed that there was some shaggy fragmentation of the stalks.  The hysteroscopic biopsy instruments were used to clean up the endometrial cavity,  followed by a smooth sharp curettage in all 4 quadrants to obtain minimal tissue fragments from the endometrial surfaces. All this was sent as a specimen in 1 container at no time was there any complications noted.  Photos were taken to document the polyps and the post polyp extraction appearance  Patient was recovery room in good condition with EBL minimal to 5 cc.  Sponge and needle counts were correct 

## 2020-01-26 NOTE — Anesthesia Postprocedure Evaluation (Signed)
Anesthesia Post Note  Patient: Alyssa Romero  Procedure(s) Performed: DILATATION AND CURETTAGE /HYSTEROSCOPY (N/A ) ENDOMETRIAL POLYPECTOMY (N/A )  Patient location during evaluation: PACU Anesthesia Type: MAC and Spinal Level of consciousness: awake and alert, oriented, awake and patient cooperative Pain management: pain level controlled Vital Signs Assessment: post-procedure vital signs reviewed and stable Respiratory status: spontaneous breathing, respiratory function stable and nonlabored ventilation Cardiovascular status: stable Postop Assessment: no apparent nausea or vomiting Anesthetic complications: no     Last Vitals:  Vitals:   01/26/20 0755  BP: 136/85  Pulse: 77  Resp: 15  Temp: 36.9 C    Last Pain:  Vitals:   01/26/20 0755  TempSrc: Oral  PainSc: 0-No pain                 [name]

## 2020-01-26 NOTE — Anesthesia Procedure Notes (Signed)
Spinal  Patient location during procedure: OR Start time: 01/26/2020 8:52 AM End time: 01/26/2020 8:58 AM Staffing Anesthesiologist: Molli Barrows, MD Preanesthetic Checklist Completed: patient identified, IV checked, site marked, risks and benefits discussed, surgical consent, monitors and equipment checked, pre-op evaluation and timeout performed Spinal Block Patient position: sitting Prep: Betadine Patient monitoring: heart rate, cardiac monitor, continuous pulse ox and blood pressure Approach: midline Location: L3-4 Injection technique: single-shot Needle Needle type: Spinocan  Needle gauge: 24 G Needle length: 9 cm Needle insertion depth: 7 cm Assessment Sensory level: T10

## 2020-01-26 NOTE — Transfer of Care (Signed)
Immediate Anesthesia Transfer of Care Note  Patient: Alyssa Romero  Procedure(s) Performed: DILATATION AND CURETTAGE /HYSTEROSCOPY (N/A ) ENDOMETRIAL POLYPECTOMY (N/A )  Patient Location: PACU  Anesthesia Type:Spinal  Level of Consciousness: awake, alert , oriented and patient cooperative  Airway & Oxygen Therapy: Patient Spontanous Breathing  Post-op Assessment: Report given to RN and Post -op Vital signs reviewed and stable  Post vital signs: Reviewed and stable  Last Vitals:  Vitals Value Taken Time  BP    Temp    Pulse 80 01/26/20 0936  Resp 11 01/26/20 0936  SpO2 94 % 01/26/20 0936  Vitals shown include unvalidated device data.  Last Pain:  Vitals:   01/26/20 0755  TempSrc: Oral  PainSc: 0-No pain      Patients Stated Pain Goal: 8 (01/26/20 0755)  Complications: No apparent anesthesia complications

## 2020-01-26 NOTE — Telephone Encounter (Signed)
I spoke with Alyssa Romero before the procedure, in the preop area, and confirmed the planned procedure, and spoke with anesthesia about use of spinal and her concern over GERD if placed supine. Procedure reviewed again, all questions answered for her and for anesthesia.   I was unable to document as a standard update as the chart had converted to intraop views. This note is to serve as that documentation.

## 2020-01-26 NOTE — Brief Op Note (Signed)
01/26/2020  9:37 AM  PATIENT:  Alyssa Romero  59 y.o. female  PRE-OPERATIVE DIAGNOSIS:  ENDOMETRIAL  POLYP, postmenopausal bleeding due to endometrial polyp  POST-OPERATIVE DIAGNOSIS:  ENDOMETRIAL  POLYP same  PROCEDURE:  Procedure(s): DILATATION AND CURETTAGE /HYSTEROSCOPY (N/A) ENDOMETRIAL POLYPECTOMY (N/A)  SURGEON:  Surgeon(s) and Role:    Tilda Burrow, MD - Primary  PHYSICIAN ASSISTANT:   ASSISTANTS: none   ANESTHESIA:   spinal  EBL: Minimal to 5 cc  BLOOD ADMINISTERED:none  DRAINS: none   LOCAL MEDICATIONS USED:  NONE  SPECIMEN:  Source of Specimen:  Endometrial polyp, endometrial curettings  DISPOSITION OF SPECIMEN:  PATHOLOGY  COUNTS:  YES  TOURNIQUET:  * No tourniquets in log *  DICTATION: .Dragon Dictation  PLAN OF CARE: Discharge to home after PACU  PATIENT DISPOSITION:  PACU - hemodynamically stable.   Delay start of Pharmacological VTE agent (>24hrs) due to surgical blood loss or risk of bleeding: not applicable   Details of procedure: Patient was taken the operating room prepped and draped with legs in high lithotomy support after spinal anesthesia was confirmed as being very effective. Timeout was conducted and procedure confirmed by operative team. Speculum was inserted cervix grasped with cervix tenaculum, sounded in the mid position to 8 cm, dilated to 23 [place] allowing introduction of the rigid 30 degree operative hysteroscope without difficulty identifying several endometrial polyps the largest of which originated from the left posterior fundal portion of the uterus.  The hysteroscopic biopsy device was used to amputate the majority of the stalk off of the largest polyp and Randall stone forceps used to probe the endometrial cavity and extract and pull off the polyps.  Repeat hysteroscopy revealed that there was some shaggy fragmentation of the stalks.  The hysteroscopic biopsy instruments were used to clean up the endometrial cavity,  followed by a smooth sharp curettage in all 4 quadrants to obtain minimal tissue fragments from the endometrial surfaces. All this was sent as a specimen in 1 container at no time was there any complications noted.  Photos were taken to document the polyps and the post polyp extraction appearance  Patient was recovery room in good condition with EBL minimal to 5 cc.  Sponge and needle counts were correct

## 2020-01-27 ENCOUNTER — Telehealth: Payer: Self-pay | Admitting: Obstetrics and Gynecology

## 2020-01-27 LAB — SURGICAL PATHOLOGY

## 2020-01-27 NOTE — Telephone Encounter (Signed)
Message left on pt personal phone that results are benign.

## 2020-02-10 ENCOUNTER — Ambulatory Visit (INDEPENDENT_AMBULATORY_CARE_PROVIDER_SITE_OTHER): Payer: BC Managed Care – PPO | Admitting: Obstetrics and Gynecology

## 2020-02-10 ENCOUNTER — Encounter: Payer: Self-pay | Admitting: Obstetrics and Gynecology

## 2020-02-10 ENCOUNTER — Other Ambulatory Visit: Payer: Self-pay

## 2020-02-10 VITALS — BP 129/82 | HR 93 | Ht 67.0 in | Wt 271.0 lb

## 2020-02-10 DIAGNOSIS — N939 Abnormal uterine and vaginal bleeding, unspecified: Secondary | ICD-10-CM | POA: Diagnosis not present

## 2020-02-10 DIAGNOSIS — N840 Polyp of corpus uteri: Secondary | ICD-10-CM | POA: Diagnosis not present

## 2020-02-10 DIAGNOSIS — N950 Postmenopausal bleeding: Secondary | ICD-10-CM | POA: Diagnosis not present

## 2020-02-10 NOTE — Patient Instructions (Signed)
Colonoscopy, Adult A colonoscopy is a procedure to look at the entire large intestine. This procedure is done using a long, thin, flexible tube that has a camera on the end. You may have a colonoscopy:  As a part of normal colorectal screening.  If you have certain symptoms, such as: ? A low number of red blood cells in your blood (anemia). ? Diarrhea that does not go away. ? Pain in your abdomen. ? Blood in your stool. A colonoscopy can help screen for and diagnose medical problems, including:  Tumors.  Extra tissue that grows where mucus forms (polyps).  Inflammation.  Areas of bleeding. Tell your health care provider about:  Any allergies you have.  All medicines you are taking, including vitamins, herbs, eye drops, creams, and over-the-counter medicines.  Any problems you or family members have had with anesthetic medicines.  Any blood disorders you have.  Any surgeries you have had.  Any medical conditions you have.  Any problems you have had with having bowel movements.  Whether you are pregnant or may be pregnant. What are the risks? Generally, this is a safe procedure. However, problems may occur, including:  Bleeding.  Damage to your intestine.  Allergic reactions to medicines given during the procedure.  Infection. This is rare. What happens before the procedure? Eating and drinking restrictions Follow instructions from your health care provider about eating or drinking restrictions, which may include:  A few days before the procedure: ? Follow a low-fiber diet. ? Avoid nuts, seeds, dried fruit, raw fruits, and vegetables.  1-3 days before the procedure: ? Eat only gelatin dessert or ice pops. ? Drink only clear liquids, such as water, clear juice, clear broth or bouillon, black coffee or tea, or clear soft drinks or sports drinks. ? Avoid liquids that contain red or purple dye.  The day of the procedure: ? Do not eat solid foods. You may  continue to drink clear liquids until up to 2 hours before the procedure. ? Do not eat or drink anything starting 2 hours before the procedure, or within the time period that your health care provider recommends. Bowel prep If you were prescribed a bowel prep to take by mouth (orally) to clean out your colon:  Take it as told by your health care provider. Starting the day before your procedure, you will need to drink a large amount of liquid medicine. The liquid will cause you to have many bowel movements of loose stool until your stool becomes almost clear or light green.  If your skin or the opening between the buttocks (anus) gets irritated from diarrhea, you may relieve the irritation using: ? Wipes with medicine in them, such as adult wet wipes with aloe and vitamin E. ? A product to soothe skin, such as petroleum jelly.  If you vomit while drinking the bowel prep: ? Take a break for up to 60 minutes. ? Begin the bowel prep again. ? Call your health care provider if you keep vomiting or you cannot take the bowel prep without vomiting.  To clean out your colon, you may also be given: ? Laxative medicines. These help you have a bowel movement. ? Instructions for enema use. An enema is liquid medicine injected into your rectum. Medicines Ask your health care provider about:  Changing or stopping your regular medicines or supplements. This is especially important if you are taking iron supplements, diabetes medicines, or blood thinners.  Taking medicines such as aspirin and ibuprofen. These medicines   can thin your blood. Do not take these medicines unless your health care provider tells you to take them.  Taking over-the-counter medicines, vitamins, herbs, and supplements. General instructions  Ask your health care provider what steps will be taken to help prevent infection. These may include washing skin with a germ-killing soap.  Plan to have someone take you home from the hospital  or clinic. What happens during the procedure?   An IV will be inserted into one of your veins.  You may be given one or more of the following: ? A medicine to help you relax (sedative). ? A medicine to numb the area (local anesthetic). ? A medicine to make you fall asleep (general anesthetic). This is rarely needed.  You will lie on your side with your knees bent.  The tube will: ? Have oil or gel put on it (be lubricated). ? Be inserted into your anus. ? Be gently eased through all parts of your large intestine.  Air will be sent into your colon to keep it open. This may cause some pressure or cramping.  Images will be taken with the camera and will appear on a screen.  A small tissue sample may be removed to be looked at under a microscope (biopsy). The tissue may be sent to a lab for testing if any signs of problems are found.  If small polyps are found, they may be removed and checked for cancer cells.  When the procedure is finished, the tube will be removed. The procedure may vary among health care providers and hospitals. What happens after the procedure?  Your blood pressure, heart rate, breathing rate, and blood oxygen level will be monitored until you leave the hospital or clinic.  You may have a small amount of blood in your stool.  You may pass gas and have mild cramping or bloating in your abdomen. This is caused by the air that was used to open your colon during the exam.  Do not drive for 24 hours after the procedure.  It is up to you to get the results of your procedure. Ask your health care provider, or the department that is doing the procedure, when your results will be ready. Summary  A colonoscopy is a procedure to look at the entire large intestine.  Follow instructions from your health care provider about eating and drinking before the procedure.  If you were prescribed an oral bowel prep to clean out your colon, take it as told by your health care  provider.  During the colonoscopy, a flexible tube with a camera on its end is inserted into the anus and then passed into the other parts of the large intestine. This information is not intended to replace advice given to you by your health care provider. Make sure you discuss any questions you have with your health care provider. Document Revised: 05/01/2019 Document Reviewed: 05/01/2019 Elsevier Patient Education  2020 Elsevier Inc.  

## 2020-02-10 NOTE — Progress Notes (Signed)
PATIENT ID: Alyssa Romero, female     DOB: 1960/11/02, 59 y.o.     MRN: [id number]  Subjective:  Alyssa Romero is a 59 y.o. female now 2 weeks status post dilatation and curettage/hysteroscopy with endometrial polypectomy.   She notes that she had blood from her anus post surgery. She wonders if it was from all of the medications that she was on as she normally does not handle them well. She notes that she has never had a colonoscopy. She has seen Dr Darrick Penna.  Review of Systems Negative except recent blood in stool   Diet:   normal   Bowel movements : abnormal with patient reported blood in stool.  The patient is not having any pain.  Objective:  BP 129/82 (BP Location: Right Arm, Patient Position: Sitting, Cuff Size: Normal)   Pulse 93   Ht 5\' 7"  (1.702 m)   Wt 271 lb (122.9 kg)   BMI 42.44 kg/m  General:Well developed, well nourished.  No acute distress. Abdomen: Bowel sounds normal, soft, non-tender. Pelvic Exam:    External Genitalia:  Normal.    Vagina: Normal    Cervix: Normal    Uterus: Normal     Adnexa/Bimanual: Normal  Incision(s): N/A     Assessment:  Post-Op 2 weeks s/p dilatation and curettage/hysteroscopy with endometrial polypectomy  Path : Benign endometrial polyp  Stable except RECTAL BLEEDING NOTED WITH DIARRHEA postoperatively. X 3 D Without recurrence.    Plan:  1. 3 stool cards sent home to check for silent blood. 2. Recommended colonoscopy. Patient in agreement. Referred to [facility] 3. Activity restrictions: none 4. return to work:now 5. Follow up in prn\ weeks.   By signing my name below, I, [facility], attest that this documentation has been prepared under the direction and in the presence of [employer], MD. Electronically Signed: Tilda Burrow Medical Scribe. 02/10/20. 10:15 AM.  I personally performed the services described in this documentation, which was SCRIBED in my presence. The recorded information has been reviewed and  considered accurate. It has been edited as necessary during review. 02/12/20, MD

## 2021-08-09 ENCOUNTER — Other Ambulatory Visit (HOSPITAL_COMMUNITY)
Admission: RE | Admit: 2021-08-09 | Discharge: 2021-08-09 | Disposition: A | Discharge status: Home / Self Care | Payer: BC Managed Care – PPO | Source: Ambulatory Visit | Attending: Adult Health | Admitting: Adult Health

## 2021-08-09 ENCOUNTER — Ambulatory Visit: Payer: BC Managed Care – PPO | Admitting: Adult Health

## 2021-08-09 ENCOUNTER — Other Ambulatory Visit: Payer: Self-pay

## 2021-08-09 ENCOUNTER — Encounter: Payer: Self-pay | Admitting: Adult Health

## 2021-08-09 VITALS — BP 118/78 | HR 85 | Ht 65.0 in | Wt 276.0 lb

## 2021-08-09 DIAGNOSIS — N950 Postmenopausal bleeding: Secondary | ICD-10-CM

## 2021-08-09 DIAGNOSIS — Z124 Encounter for screening for malignant neoplasm of cervix: Secondary | ICD-10-CM | POA: Diagnosis not present

## 2021-08-09 NOTE — Progress Notes (Signed)
  Subjective:     Patient ID: Alyssa Romero, female   DOB: 11-09-1960, 60 y.o.   MRN: [id number]  HPI Alyssa Romero is a 60 year old white female, separated, PM in complaining of having 2-3 episodes of ref vaginal bleeding. Not sure when had pap last. Had D&C last year for polyps. PCP is [facility].  Review of Systems PMB Reviewed past medical,surgical, social and family history. Reviewed medications and allergies.     Objective:   Physical Exam BP 118/78 (BP Location: Left Arm, Patient Position: Sitting, Cuff Size: Large)   Pulse 85   Ht 5\' 5"  (1.651 m)   Wt 276 lb (125.2 kg)   BMI 45.93 kg/m     Skin warm and dry.Pelvic: external genitalia is normal in appearance no lesions, vagina: pale and dry,urethra has no lesions or masses noted, cervix:smooth, pap with HR HPV genotyping, uterus: normal size, shape and contour, non tender, no masses felt, adnexa: no masses or tenderness noted. Bladder is non tender and no masses felt.   Fall risk is low  [facility] - 08/09/21 1535       Pregnancy Intention Screening   Does the patient want to become pregnant in the next year? No    Does the patient's partner want to become pregnant in the next year? No    Would the patient like to discuss contraceptive options today? No      Contraception Wrap Up   Current Method No Method - Other Reason   postmenopausal   End Method No Method - Other Reason   postmenopausal   Contraception Counseling Provided No            Examination chaperoned by 08/11/21 LPN  Assessment:    1. PMB (postmenopausal bleeding) Pelvic Malachy Mood scheduled at [facility] 08/14/21 a 3;30 pm  2. Routine Papanicolaou smear Pap sent     Plan:     Will talk when 08/16/21 results back

## 2021-08-11 LAB — CYTOLOGY - PAP
Comment: NEGATIVE
Diagnosis: NEGATIVE
High risk HPV: NEGATIVE

## 2021-08-14 ENCOUNTER — Ambulatory Visit (HOSPITAL_COMMUNITY)
Admission: RE | Admit: 2021-08-14 | Discharge: 2021-08-14 | Disposition: A | Discharge status: Home / Self Care | Payer: BC Managed Care – PPO | Source: Ambulatory Visit | Attending: Adult Health | Admitting: Adult Health

## 2021-08-14 ENCOUNTER — Other Ambulatory Visit: Payer: Self-pay

## 2021-08-14 DIAGNOSIS — N950 Postmenopausal bleeding: Secondary | ICD-10-CM | POA: Diagnosis present

## 2021-08-15 ENCOUNTER — Telehealth: Payer: Self-pay | Admitting: Adult Health

## 2021-08-15 NOTE — Telephone Encounter (Signed)
Pt aware that endometrium is thickened will need endometrial biopsy to make appt, she does not want D&C like in the past.

## 2021-08-15 NOTE — Telephone Encounter (Signed)
[facility] - pt needs endometrial biopsy Dr. Despina Hidden or Dr. Charlotta Newton

## 2021-08-28 ENCOUNTER — Other Ambulatory Visit: Payer: BC Managed Care – PPO | Admitting: Obstetrics & Gynecology

## 2022-01-24 ENCOUNTER — Ambulatory Visit (HOSPITAL_COMMUNITY)
Admission: RE | Admit: 2022-01-24 | Discharge: 2022-01-24 | Disposition: A | Discharge status: Home / Self Care | Payer: BC Managed Care – PPO | Source: Ambulatory Visit | Attending: Internal Medicine | Admitting: Internal Medicine

## 2022-01-24 ENCOUNTER — Ambulatory Visit: Payer: BC Managed Care – PPO | Admitting: Internal Medicine

## 2022-01-24 ENCOUNTER — Encounter: Payer: Self-pay | Admitting: Internal Medicine

## 2022-01-24 DIAGNOSIS — R058 Other specified cough: Secondary | ICD-10-CM | POA: Diagnosis present

## 2022-01-24 MED ORDER — FAMOTIDINE 20 MG PO TABS: ORAL_TABLET | ORAL | 11 refills | Status: AC

## 2022-01-24 MED ORDER — PANTOPRAZOLE SODIUM 40 MG PO TBEC: 40.0000 mg | DELAYED_RELEASE_TABLET | Freq: Every day | ORAL | 2 refills | Status: AC

## 2022-01-24 NOTE — Assessment & Plan Note (Addendum)
Onset in her 20's with ? Viral exp  ?- Dg Es 10/07/14  Large hiatal hernia at least 50% of the stomach in the lower ?Mediastinum. ?- Allergy screen 01/24/2022 >  Eos 0. /  IgE   ? ?The most common causes of chronic cough in immunocompetent adults include the following: upper airway cough syndrome (UACS), previously referred to as postnasal drip syndrome (PNDS), which is caused by variety of rhinosinus conditions; (2) asthma; (3) GERD; (4) chronic bronchitis from cigarette smoking or other inhaled environmental irritants; (5) nonasthmatic eosinophilic bronchitis; and (6) bronchiectasis. ? ? These conditions, singly or in combination, have accounted for up to 94% of the causes of chronic cough in prospective studies. ? ? Other conditions have constituted no >6% of the causes in prospective studies These have included bronchogenic carcinoma, chronic interstitial pneumonia, sarcoidosis, left ventricular failure, ACEI-induced cough, and aspiration from a condition associated with pharyngeal dysfunction.   ? ?Chronic cough is often simultaneously caused by more than one condition. A single cause has been found from 38 to 82% of the time, multiple causes from 18 to 62%. Multiply caused cough has been the result of three diseases up to 42% of the time.  ? ?Of the three most common causes of  Sub-acute / recurrent or chronic cough, only one (GERD)  can actually contribute to/ trigger  the other two (asthma and post nasal drip syndrome)  and perpetuate the cylce of cough. ? ?While not intuitively obvious, many patients with chronic low grade reflux do not cough until there is a primary insult that disturbs the protective epithelial barrier and exposes sensitive nerve endings.   This is typically viral but can due to PNDS and  either may apply here.   ? ?>>>  The point is that once this occurs, it is difficult to eliminate the cycle  using anything but a maximally effective acid suppression regimen at least in the short run,  accompanied by an appropriate diet to address non acid GERD and control / eliminate pnds with 1st gen H1 blockers per guidelines  If tol and use non-mint/menthol products to suppress the urge to cough plus >>> also so addedDepomedrol 120 mg IM in case of component of Th-2 driven upper or lower airways inflammation (if cough responds short term only to relapse before return while will on full rx for uacs (as above), then  that would point to allergic rhinitis/ asthma or eos bronchitis as alternative dx)  ? ?>>> refer to Dr Daphine Deutscher for consideration for hernia surgery.  ? ? ?    ?  ? ?Each maintenance medication was reviewed in detail including emphasizing most importantly the difference between maintenance and prns and under what circumstances the prns are to be triggered using an action plan format where appropriate. ? ?Total time for H and P, chart review, counseling,   and generating customized AVS unique to this office visit / same day charting  > 45 min with pt new to me ?     ?

## 2022-01-24 NOTE — Progress Notes (Signed)
? ?Alyssa Romero, female    DOB: [birth year]  MRN: [id number] ? ? ?Brief patient profile:  ?48   yowf  quit smoking 1993 reports onset of cough age 37s while working at [employer] on exp to cold storage with neg allergy serology self- referred to pulmonary clinic in [place]  01/24/2022 for cough. ? ?In her 74s noted p exp to  maple tree pollen in [place] rhinitis and cough in spring then back with heat came on  each winter and noted summers were the best then spring of 2023 worst ever with cough to point of vomiting. 1st aware of reflux in her 66s and Dg Es 10/07/14 large [place] rec surgery but she declined ? ? ? ?History of Present Illness  ?01/24/2022  Pulmonary/ 1st office eval/ [facility] Novas / [facility]  ?Chief Complaint  ?Patient presents with  ? Consult  ?  Patient states that she has "crud" year round with coughing and congestion. Sometimes produces a bunch of mucus and feels like she's choking. Has a hernia and some lung nodules.   ?Dyspnea: not limited unless coughing  ?Cough: severe paroxysms to point of vomiting / wheezing  ?Sleep: on side with wedge and one pillow/  ?SABA use: none  ?Zyrtec no better p 2 weeks  ? ?No obvious other patterns in day to day or daytime variability or assoc excess/ purulent sputum or mucus plugs or hemoptysis or cp or chest tightness, subjective wheeze or overt sinus or hb symptoms.  ? ?Sleeping  without nocturnal  or early am exacerbation  of respiratory  c/o's or need for noct saba. Also denies any obvious fluctuation of symptoms with weather or environmental changes or other aggravating or alleviating factors except as outlined above  ? ?No unusual exposure hx or h/o childhood pna/ asthma or knowledge of premature birth. ? ?Current Allergies, Complete Past Medical History, Past Surgical History, Family History, and Social History were reviewed in [employer] record. ? ?ROS  The following are not active complaints unless bolded ?Hoarseness, sore throat,  dysphagia, dental problems, itching, sneezing,  nasal congestion or discharge of excess mucus or purulent secretions, ear ache,   fever, chills, sweats, unintended wt loss or wt gain, classically pleuritic or exertional cp,  orthopnea pnd or arm/hand swelling  or leg swelling, presyncope, palpitations, abdominal pain, anorexia, nausea, vomiting, diarrhea  or change in bowel habits or change in bladder habits, change in stools or change in urine, dysuria, hematuria,  rash, arthralgias, visual complaints, headache, numbness, weakness or ataxia or problems with walking or coordination,  change in mood or  memory. ?      ?   ? ?Past Medical History:  ?Diagnosis Date  ? Anemia   ? DDD (degenerative disc disease), cervical   ? 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 GERD (gastroesophageal reflux disease)   ? Hiatal hernia   ? Muscle spasm   ? "she has recurrent muscle spasms all over for as long as I've known her" per husband  ? Pain in right shoulder   ? Pneumonia   ? PONV (postoperative nausea and vomiting)   ? sts "phenergan does not work".  ? Sleep apnea   ? tested positive but did not obtain CPAP  ? ? ?Outpatient Medications Prior to Visit  ?Medication Sig Dispense Refill  ? Acetaminophen (TYLENOL PO) Take by mouth.    ? VITAMIN D PO Take by mouth.    ? ?No facility-administered medications prior to  visit.  ? ? ? ?Objective:  ?  ? ?BP (!) 140/92 (BP Location: Left Arm, Patient Position: Sitting, Cuff Size: Large)   Pulse 93   Temp 98.3 ?F (36.8 ?C) (Oral)   Ht 5\' 6"  (1.676 m)   Wt 280 lb 3.2 oz (127.1 kg)   SpO2 97%   BMI 45.23 kg/m?  ? ?SpO2: 97 % amb obese wf nad freq throat clearing  ? ? HEENT : nl turbinates/ no pnd  ? ? ?NECK :  without JVD/Nodes/TM/ nl carotid upstrokes bilaterally ? ? ?LUNGS: no acc muscle use,  Nl contour chest which is clear to A and P bilaterally without cough on insp or exp maneuvers ? ? ?CV:  RRR  no s3 or murmur or increase in P2, and no edema  ? ?ABD:  soft and nontender with nl inspiratory  excursion in the supine position. No bruits or organomegaly appreciated, bowel sounds nl ? ?MS:  Nl gait/ ext warm without deformities, calf tenderness, cyanosis or clubbing ?No obvious joint restrictions  ? ?SKIN: warm and dry without lesions   ? ?NEURO:  alert, approp, nl sensorium with  no motor or cerebellar deficits apparent.  ? ?CXR PA and Lateral:   01/24/2022 :    ?I personally reviewed images and impression is as follows:     ?Very large HH/ no other significant findings ? ? ?Assessment  ? ?Upper airway cough syndrome ?Onset in her 20's with ? Viral exp  ?- Dg Es 10/07/14  Large hiatal hernia at least 50% of the stomach in the lower ?Mediastinum. ? ?The most common causes of chronic cough in immunocompetent adults include the following: upper airway cough syndrome (UACS), previously referred to as postnasal drip syndrome (PNDS), which is caused by variety of rhinosinus conditions; (2) asthma; (3) GERD; (4) chronic bronchitis from cigarette smoking or other inhaled environmental irritants; (5) nonasthmatic eosinophilic bronchitis; and (6) bronchiectasis. ? ? These conditions, singly or in combination, have accounted for up to 94% of the causes of chronic cough in prospective studies. ? ? Other conditions have constituted no >6% of the causes in prospective studies These have included bronchogenic carcinoma, chronic interstitial pneumonia, sarcoidosis, left ventricular failure, ACEI-induced cough, and aspiration from a condition associated with pharyngeal dysfunction.   ? ?Chronic cough is often simultaneously caused by more than one condition. A single cause has been found from 38 to 82% of the time, multiple causes from 18 to 62%. Multiply caused cough has been the result of three diseases up to 42% of the time.  ? ?Of the three most common causes of  Sub-acute / recurrent or chronic cough, only one (GERD)  can actually contribute to/ trigger  the other two (asthma and post nasal drip syndrome)  and  perpetuate the cylce of cough. ? ?While not intuitively obvious, many patients with chronic low grade reflux do not cough until there is a primary insult that disturbs the protective epithelial barrier and exposes sensitive nerve endings.   This is typically viral but can due to PNDS and  either may apply here.   ? ?>>>  The point is that once this occurs, it is difficult to eliminate the cycle  using anything but a maximally effective acid suppression regimen at least in the short run, accompanied by an appropriate diet to address non acid GERD and control / eliminate pnds with 1st gen H1 blockers per guidelines  If tol and use non-mint/menthol products to suppress the urge to cough  plus >>> also so addedDepomedrol 120 mg IM in case of component of Th-2 driven upper or lower airways inflammation (if cough responds short term only to relapse before return while will on full rx for uacs (as above), then  that would point to allergic rhinitis/ asthma or eos bronchitis as alternative dx)  ? ?>>> refer to Dr Hassell Done for consideration for hernia surgery.  ? ? ?    ?  ? ?Each maintenance medication was reviewed in detail including emphasizing most importantly the difference between maintenance and prns and under what circumstances the prns are to be triggered using an action plan format where appropriate. ? ?Total time for H and P, chart review, counseling,   and generating customized AVS unique to this office visit / same day charting  > 45 min with pt new to me ?     ? ? ? ? ?Christinia Gully, MD ?01/24/2022 ?   ?

## 2022-01-24 NOTE — Patient Instructions (Addendum)
GERD (REFLUX)  is an extremely common cause of respiratory symptoms just like yours , many times with no obvious heartburn at all.  ? ? It can be treated with medication, but also with lifestyle changes including elevation of the head of your bed (ideally with 6 -8inch blocks under the headboard of your bed),  Smoking cessation, avoidance of late meals, excessive alcohol, and avoid fatty foods, chocolate, peppermint, colas, red wine, and acidic juices such as orange juice.  ?NO MINT OR MENTHOL PRODUCTS SO NO COUGH DROPS  ?USE SUGARLESS CANDY INSTEAD (Jolley ranchers or Stover's or Life Savers) or even ice chips will also do - the key is to swallow to prevent all throat clearing. ?NO OIL BASED VITAMINS - use powdered substitutes.  Avoid fish oil when coughing.  ? ? ?Pantoprazole (protonix) 40 mg   Take  30-60 min before first meal of the day and Pepcid (famotidine)  20 mg after supper or sometime in evening  until return to office - this is the best way to tell whether stomach acid is contributing to your problem.   ? ?Depomedrol  120 mg today  ? ?For drainage / throat tickle try take CHLORPHENIRAMINE  4 mg  ("Allergy Relief" 4mg   at [facility] should be easiest to find in the blue box usually on bottom shelf)  take one every 4 hours as needed - extremely effective and inexpensive over the counter- may cause drowsiness so start with just a dose or two an hour before bedtime and see how you tolerate it before trying in daytime.  ? ?Please remember to go to the lab department   for your tests - we will call you with the results when they are available. ?    ? ?Please remember to go to the  x-ray department  @  [facility] for your tests - we will call you with the results when they are available    ? ?I will be referring you to Dr [facility] in [place] ? ? ? ? ? ?

## 2022-01-26 LAB — CBC WITH DIFFERENTIAL/PLATELET
Basophils Absolute: 0 10*3/uL (ref 0.0–0.2)
Basos: 1 %
EOS (ABSOLUTE): 0.2 10*3/uL (ref 0.0–0.4)
Eos: 3 %
Hematocrit: 46.4 % (ref 34.0–46.6)
Hemoglobin: 15.3 g/dL (ref 11.1–15.9)
Immature Grans (Abs): 0 10*3/uL (ref 0.0–0.1)
Immature Granulocytes: 0 %
Lymphocytes Absolute: 1.8 10*3/uL (ref 0.7–3.1)
Lymphs: 29 %
MCH: 28.5 pg (ref 26.6–33.0)
MCHC: 33 g/dL (ref 31.5–35.7)
MCV: 86 fL (ref 79–97)
Monocytes Absolute: 0.4 10*3/uL (ref 0.1–0.9)
Monocytes: 7 %
Neutrophils Absolute: 3.8 10*3/uL (ref 1.4–7.0)
Neutrophils: 60 %
Platelets: 321 10*3/uL (ref 150–450)
RBC: 5.37 x10E6/uL — ABNORMAL HIGH (ref 3.77–5.28)
RDW: 13.2 % (ref 11.7–15.4)
WBC: 6.3 10*3/uL (ref 3.4–10.8)

## 2022-01-26 LAB — IGE: IgE (Immunoglobulin E), Serum: 32 IU/mL (ref 6–495)

## 2022-01-29 NOTE — Progress Notes (Signed)
Spoke with pt and notified of results per Dr. Wert. Pt verbalized understanding and denied any questions. 

## 2022-02-06 ENCOUNTER — Institutional Professional Consult (permissible substitution): Payer: BC Managed Care – PPO | Admitting: Internal Medicine

## 2022-02-12 ENCOUNTER — Institutional Professional Consult (permissible substitution): Payer: BC Managed Care – PPO | Admitting: Internal Medicine

## 2022-09-11 ENCOUNTER — Encounter (HOSPITAL_COMMUNITY): Payer: Self-pay

## 2022-09-11 ENCOUNTER — Emergency Department (HOSPITAL_COMMUNITY): Payer: No Typology Code available for payment source

## 2022-09-11 ENCOUNTER — Emergency Department (HOSPITAL_COMMUNITY)
Admission: EM | Admit: 2022-09-11 | Discharge: 2022-09-11 | Disposition: A | Discharge status: Home / Self Care | Payer: No Typology Code available for payment source | Attending: Emergency Medicine | Admitting: Emergency Medicine

## 2022-09-11 DIAGNOSIS — Y990 Civilian activity done for income or pay: Secondary | ICD-10-CM | POA: Diagnosis not present

## 2022-09-11 DIAGNOSIS — W01190A Fall on same level from slipping, tripping and stumbling with subsequent striking against furniture, initial encounter: Secondary | ICD-10-CM | POA: Insufficient documentation

## 2022-09-11 DIAGNOSIS — M545 Low back pain, unspecified: Secondary | ICD-10-CM | POA: Diagnosis present

## 2022-09-11 DIAGNOSIS — M542 Cervicalgia: Secondary | ICD-10-CM | POA: Insufficient documentation

## 2022-09-11 DIAGNOSIS — R930 Abnormal findings on diagnostic imaging of skull and head, not elsewhere classified: Secondary | ICD-10-CM | POA: Insufficient documentation

## 2022-09-11 DIAGNOSIS — W19XXXA Unspecified fall, initial encounter: Secondary | ICD-10-CM

## 2022-09-11 MED ORDER — ACETAMINOPHEN 325 MG PO TABS
650.0000 mg | ORAL_TABLET | Freq: Once | ORAL | Status: AC
  Administered 2022-09-11: 650 mg via ORAL
  Filled 2022-09-11: qty 2

## 2022-09-11 NOTE — ED Triage Notes (Signed)
Pt brought in by [facility] for a fall. Pt slipped on wet cafeteria floor at [facility] [employer] [facility]. No LOC. States she hit the left side of her head and is c/o of neck pain 7/10.

## 2022-09-11 NOTE — Discharge Instructions (Signed)
Your x-rays and CT scans did not show any new injuries.  You likely sustained a concussion during this fall.  Treat any pain, soreness, or headaches with ibuprofen and Tylenol.  Avoid activities that worsen headache symptoms.  Return to the emergency department for any new or worsening symptoms of concern.

## 2022-09-11 NOTE — ED Notes (Signed)
Patient returned from CT

## 2022-09-11 NOTE — ED Provider Notes (Signed)
[facility] EMERGENCY DEPARTMENT Provider Note   CSN: [id number] Arrival date & time: 09/11/22  1203     History  Chief Complaint  Patient presents with   Marletta LorFall    Alyssa Romero is a 61 y.o. female.   Fall  Patient presents after a fall.  Medical history includes anemia, GERD, sleep apnea.  Fall occurred shortly prior to arrival.  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  There was drinks spilled on cafeteria floor.  This caused her to slip.  When she slipped, she struck the left side of her head against a table and her left elbow on the ground.  Since the fall, she has had pain in her right side of neck, mid and lower back.  She denies any history of chronic back pain.  She denies loss of consciousness.  She is not on any blood thinning medications or any medications at all.     Home Medications Prior to Admission medications   Medication Sig Start Date End Date Taking? Authorizing Provider  Acetaminophen (TYLENOL PO) Take by mouth.    [provider]  famotidine (PEPCID) 20 MG tablet One after supper 01/24/22   Nyoka CowdenWert, Michael B, MD  pantoprazole (PROTONIX) 40 MG tablet Take 1 tablet (40 mg total) by mouth daily. Take 30-60 min before first meal of the day 01/24/22   Nyoka CowdenWert, Michael B, MD  VITAMIN D PO Take by mouth.    [provider]      Allergies    Statins, Azithromycin, Decadron [dexamethasone], and Sulfonamide derivatives    Review of Systems   Review of Systems  Musculoskeletal:  Positive for arthralgias, back pain and neck pain.  All other systems reviewed and are negative.   Physical Exam Updated Vital Signs BP 106/67   Pulse 66   Temp 97.6 F (36.4 C) (Oral)   Resp 19   Ht 5\' 7"  (1.702 m)   Wt 108 kg   SpO2 96%   BMI 37.28 kg/m  Physical Exam Vitals and nursing note reviewed.  Constitutional:      General: She is not in acute distress.    Appearance: Normal appearance. She is well-developed. She is not  ill-appearing, toxic-appearing or diaphoretic.  HENT:     Head: Normocephalic and atraumatic.     Right Ear: External ear normal.     Left Ear: External ear normal.     Nose: Nose normal.     Mouth/Throat:     Mouth: Mucous membranes are moist.     Pharynx: Oropharynx is clear.  Eyes:     Extraocular Movements: Extraocular movements intact.     Conjunctiva/sclera: Conjunctivae normal.  Cardiovascular:     Rate and Rhythm: Normal rate and regular rhythm.  Pulmonary:     Effort: Pulmonary effort is normal. No respiratory distress.  Chest:     Chest wall: No tenderness.  Abdominal:     General: There is no distension.     Palpations: Abdomen is soft.     Tenderness: There is no abdominal tenderness.  Musculoskeletal:        General: Tenderness (T12-L5) present. No swelling or deformity.     Cervical back: Normal range of motion and neck supple.     Right lower leg: No edema.     Left lower leg: No edema.  Skin:    General: Skin is warm and dry.     Coloration: Skin is not jaundiced or pale.  Neurological:     General: No focal deficit present.     Mental Status: She is alert and oriented to person, place, and time.     Cranial Nerves: No cranial nerve deficit.     Sensory: No sensory deficit.     Motor: No weakness.     Coordination: Coordination normal.  Psychiatric:        Mood and Affect: Mood normal.        Behavior: Behavior normal.        Thought Content: Thought content normal.        Judgment: Judgment normal.     ED Results / Procedures / Treatments   Labs (all labs ordered are listed, but only abnormal results are displayed) Labs Reviewed - No data to display  EKG None  Radiology CT HEAD WO CONTRAST  Result Date: 09/11/2022 CLINICAL DATA:  Provided history: Head trauma, moderate/severe. Polytrauma, blunt. Additional history provided: Fall, posterior head and neck pain. EXAM: CT HEAD WITHOUT CONTRAST CT CERVICAL SPINE WITHOUT CONTRAST TECHNIQUE:  Multidetector CT imaging of the head and cervical spine was performed following the standard protocol without intravenous contrast. Multiplanar CT image reconstructions of the cervical spine were also generated. RADIATION DOSE REDUCTION: This exam was performed according to the departmental dose-optimization program which includes automated exposure control, adjustment of the mA and/or kV according to patient size and/or use of iterative reconstruction technique. COMPARISON:  Cervical spine CT 09/19/2013. FINDINGS: CT HEAD FINDINGS Brain: Cerebral volume is normal. There is no acute intracranial hemorrhage. No demarcated cortical infarct. No extra-axial fluid collection. No evidence of an intracranial mass. No midline shift. Vascular: No hyperdense vessel. Atherosclerotic calcifications. Skull: No fracture or aggressive osseous lesion. Sinuses/Orbits: No mass or acute finding within the imaged orbits. No significant paranasal sinus disease at the imaged levels. CT CERVICAL SPINE FINDINGS Alignment: Straightening of the expected cervical lordosis. Slight C2-C3 grade 1 anterolisthesis. Slight C3-C4 grade 1 retrolisthesis. Skull base and vertebrae: The basion-dental and atlanto-dental intervals are maintained.No evidence of acute fracture to the cervical spine. Soft tissues and spinal canal: No prevertebral fluid or swelling. No visible canal hematoma. Disc levels: Cervical spondylosis with multilevel disc space narrowing, disc bulges/central disc protrusions, endplate spurring and uncovertebral hypertrophy. No appreciable high-grade spinal canal stenosis. Multilevel bony neural foraminal narrowing. Upper chest: No consolidation within the imaged lung apices. No visible pneumothorax. IMPRESSION: CT head: No evidence of acute intracranial abnormality. CT cervical spine: 1. No evidence of acute fracture to the cervical spine. 2. Nonspecific straightening of the expected cervical lordosis. 3. Mild grade 1 anterolisthesis  at C2-C3. 4. Mild grade 1 retrolisthesis at C3-C4. 5. Cervical spondylosis, as described. Electronically Signed   By: Jackey Loge D.O.   On: 09/11/2022 13:27   CT CERVICAL SPINE WO CONTRAST  Result Date: 09/11/2022 CLINICAL DATA:  Provided history: Head trauma, moderate/severe. Polytrauma, blunt. Additional history provided: Fall, posterior head and neck pain. EXAM: CT HEAD WITHOUT CONTRAST CT CERVICAL SPINE WITHOUT CONTRAST TECHNIQUE: Multidetector CT imaging of the head and cervical spine was performed following the standard protocol without intravenous contrast. Multiplanar CT image reconstructions of the cervical spine were also generated. RADIATION DOSE REDUCTION: This exam was performed according to the departmental dose-optimization program which includes automated exposure control, adjustment of the mA and/or kV according to patient size and/or use of iterative reconstruction technique. COMPARISON:  Cervical spine CT 09/19/2013. FINDINGS: CT HEAD FINDINGS Brain: Cerebral volume is normal. There is no acute intracranial hemorrhage. No demarcated cortical infarct.  No extra-axial fluid collection. No evidence of an intracranial mass. No midline shift. Vascular: No hyperdense vessel. Atherosclerotic calcifications. Skull: No fracture or aggressive osseous lesion. Sinuses/Orbits: No mass or acute finding within the imaged orbits. No significant paranasal sinus disease at the imaged levels. CT CERVICAL SPINE FINDINGS Alignment: Straightening of the expected cervical lordosis. Slight C2-C3 grade 1 anterolisthesis. Slight C3-C4 grade 1 retrolisthesis. Skull base and vertebrae: The basion-dental and atlanto-dental intervals are maintained.No evidence of acute fracture to the cervical spine. Soft tissues and spinal canal: No prevertebral fluid or swelling. No visible canal hematoma. Disc levels: Cervical spondylosis with multilevel disc space narrowing, disc bulges/central disc protrusions, endplate spurring and  uncovertebral hypertrophy. No appreciable high-grade spinal canal stenosis. Multilevel bony neural foraminal narrowing. Upper chest: No consolidation within the imaged lung apices. No visible pneumothorax. IMPRESSION: CT head: No evidence of acute intracranial abnormality. CT cervical spine: 1. No evidence of acute fracture to the cervical spine. 2. Nonspecific straightening of the expected cervical lordosis. 3. Mild grade 1 anterolisthesis at C2-C3. 4. Mild grade 1 retrolisthesis at C3-C4. 5. Cervical spondylosis, as described. Electronically Signed   By: Jackey Loge D.O.   On: 09/11/2022 13:27   DG Pelvis 1-2 Views  Result Date: 09/11/2022 CLINICAL DATA:  Fall EXAM: PELVIS - 1-2 VIEW COMPARISON:  Pelvis 02/04/2014 FINDINGS: There is no evidence of pelvic fracture or diastasis. No pelvic bone lesions are seen. IMPRESSION: Negative. Electronically Signed   By: Marlan Palau M.D.   On: 09/11/2022 13:09   DG Elbow Complete Left  Result Date: 09/11/2022 CLINICAL DATA:  Blunt trauma, slip and fall EXAM: LEFT ELBOW - COMPLETE 3+ VIEW COMPARISON:  None Available. FINDINGS: No fracture or dislocation of the left elbow. Joint spaces are preserved. No elbow joint effusion. Mild soft tissue edema of the olecranon. IMPRESSION: 1. No fracture or dislocation of the left elbow. No elbow joint effusion. 2. Mild soft tissue edema of the olecranon. Electronically Signed   By: Jearld Lesch M.D.   On: 09/11/2022 13:09   DG Chest 1 View  Result Date: 09/11/2022 CLINICAL DATA:  Fall EXAM: CHEST  1 VIEW COMPARISON:  Chest 01/24/2022 FINDINGS: Lungs are clear and well aerated. No infiltrate or effusion. Heart size upper normal. Negative for heart failure Hiatal hernia. IMPRESSION: No acute cardiopulmonary abnormality. Electronically Signed   By: Marlan Palau M.D.   On: 09/11/2022 13:08   DG Thoracic Spine W/Swimmers  Result Date: 09/11/2022 CLINICAL DATA:  Fall EXAM: THORACIC SPINE - 3 VIEWS COMPARISON:  None  Available. FINDINGS: There is no evidence of thoracic spine fracture. Alignment is normal. No other significant bone abnormalities are identified. IMPRESSION: Negative. Electronically Signed   By: Marlan Palau M.D.   On: 09/11/2022 13:07   DG Lumbar Spine 2-3 Views  Result Date: 09/11/2022 CLINICAL DATA:  Fall EXAM: LUMBAR SPINE - 2-3 VIEW COMPARISON:  None Available. FINDINGS: Normal alignment no fracture Disc degeneration L1-2, L2-3, L3-4 disc space narrowing and spurring. Remaining disc spaces intact. IMPRESSION: Lumbar degenerative change. No acute abnormality. Electronically Signed   By: Marlan Palau M.D.   On: 09/11/2022 13:06    Procedures Procedures    Medications Ordered in ED Medications  acetaminophen (TYLENOL) tablet 650 mg (650 mg Oral Given 09/11/22 1302)    ED Course/ Medical Decision Making/ A&P                           Medical Decision Making Amount  and/or Complexity of Data Reviewed Radiology: ordered.  Risk OTC drugs.   This patient presents to the ED for concern of fall, this involves an extensive number of treatment options, and is a complaint that carries with it a high risk of complications and morbidity.  The differential diagnosis includes acute injuries   Co morbidities that complicate the patient evaluation  anemia, GERD, sleep apnea   Additional history obtained:  Additional history obtained from N/A External records from outside source obtained and reviewed including EMR  Imaging Studies ordered:  I ordered imaging studies including CT of head and cervical spine, x-ray imaging of chest, comes, metoprolol, thoracic spine, lumbar spine I independently visualized and interpreted imaging which showed no acute findings I agree with the radiologist interpretation   Cardiac Monitoring: / EKG:  The patient was maintained on a cardiac monitor.  I personally viewed and interpreted the cardiac monitored which showed an underlying rhythm of:  Sinus rhythm  Problem List / ED Course / Critical interventions / Medication management  Patient presents after a ground-level fall.  This occurred at her place of work.  Patient is a Runner, broadcasting/film/video at a school.  She describes the fall as mechanical and caused by slipping on a spill beverage that was on the floor.  As she fell, she struck her head on a table and her elbow on the ground.  She has since had pain in these areas as well as pain to her mid and lower back.  Patient denies any blood thinner use.  On arrival in the ED, she is well-appearing.  Vital signs are normal.  Her breathing is unlabored.  She does have mild tenderness to left olecranon process.  There is no associated swelling or deformity.  Range of motion of left elbow is intact.  She has mild tenderness to left parietal scalp without laceration or hematoma.  She has pain and tenderness to the right-sided musculature of her neck.  She has tenderness to areas of T12-L5.  Patient was given Tylenol for analgesia.  She declined anything stronger.  She underwent imaging studies or areas of concern.  Imaging studies were negative for acute findings.  Patient had improved symptoms while in the ED.  She was discharged in good condition. I ordered medication including Tylenol for analgesia Reevaluation of the patient after these medicines showed that the patient improved I have reviewed the patients home medicines and have made adjustments as needed   Social Determinants of Health:  Lives independently        Final Clinical Impression(s) / ED Diagnoses Final diagnoses:  Fall, initial encounter    Rx / DC Orders ED Discharge Orders     None         Gloris Manchester, MD 09/11/22 1620

## 2023-01-15 IMAGING — US US PELVIS COMPLETE WITH TRANSVAGINAL
1 series · 13 of 25 positions shown · non-contrast
Comparison: Prior ultrasound from 12/23/2019.

CLINICAL DATA: Initial evaluation for postmenopausal bleeding.



[Series 1: us pelvic complete with transvaginal · 13 of 95 slices shown]
[im 1/95]
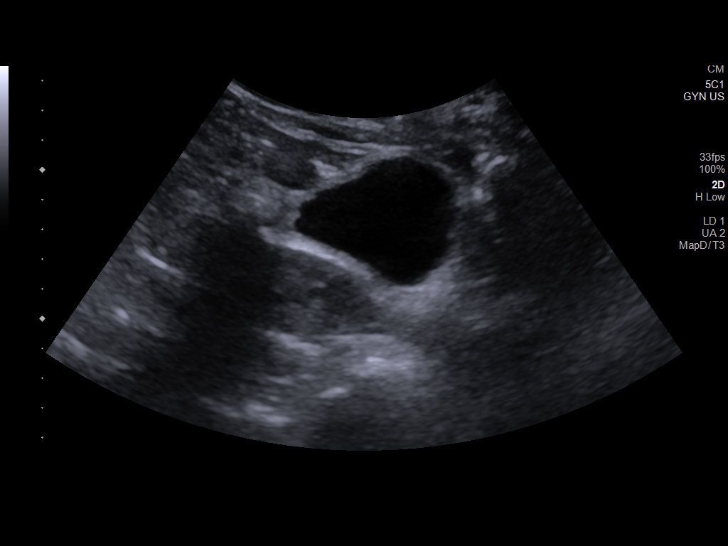
[im 8/95]
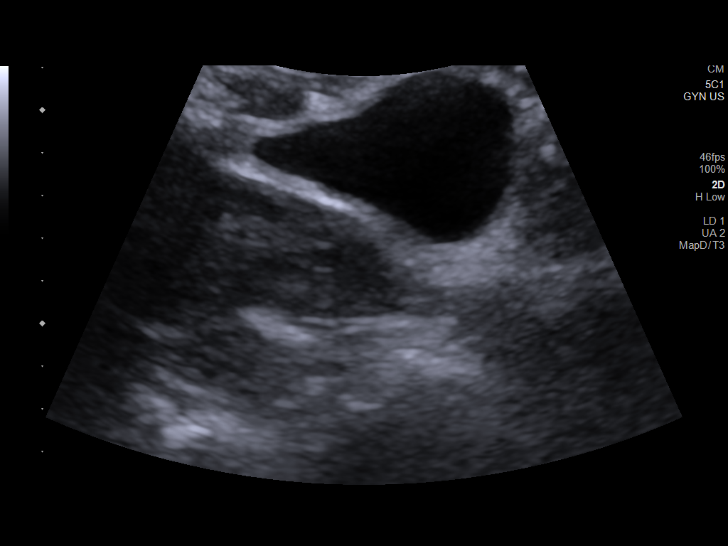
[im 16/95]
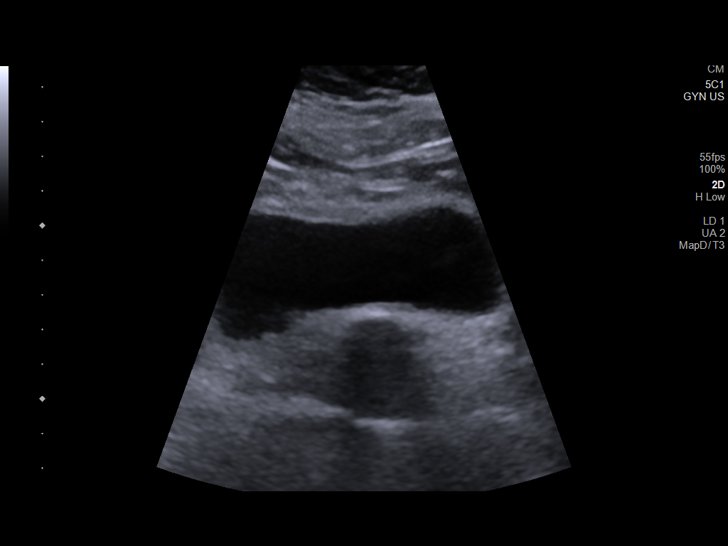
[im 24/95]
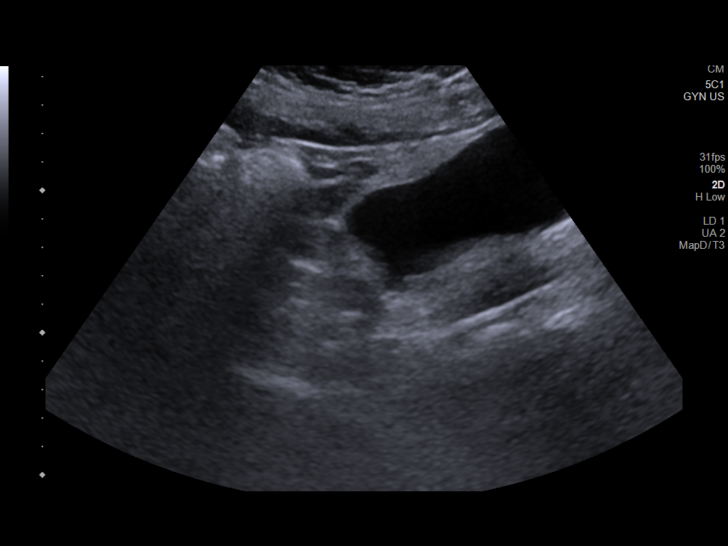
[im 32/95]
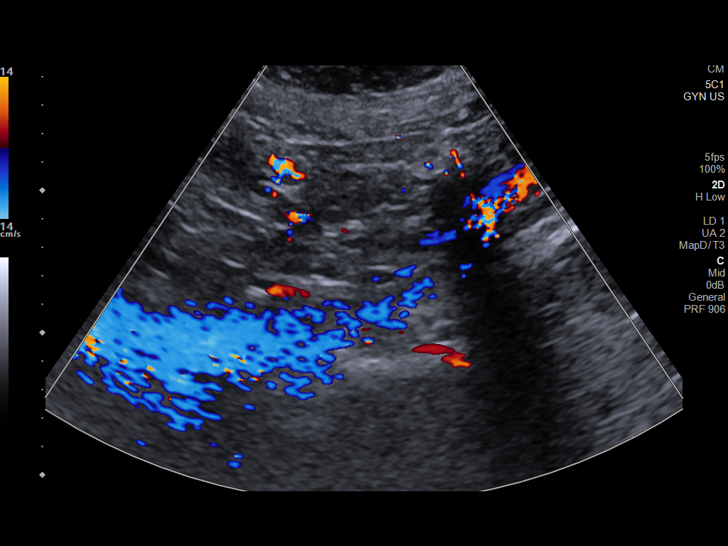
[im 40/95]
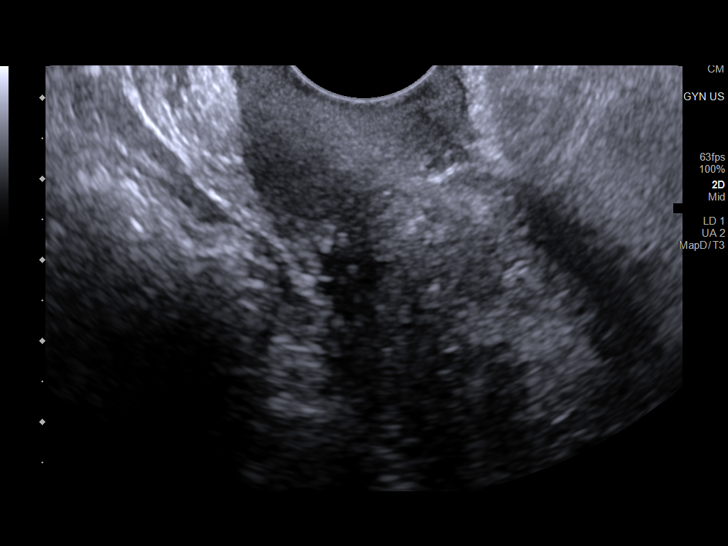
[im 48/95]
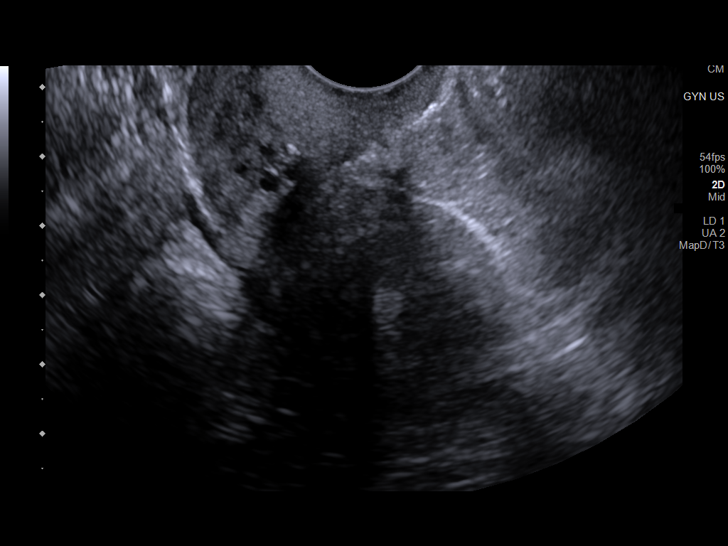
[im 55/95]
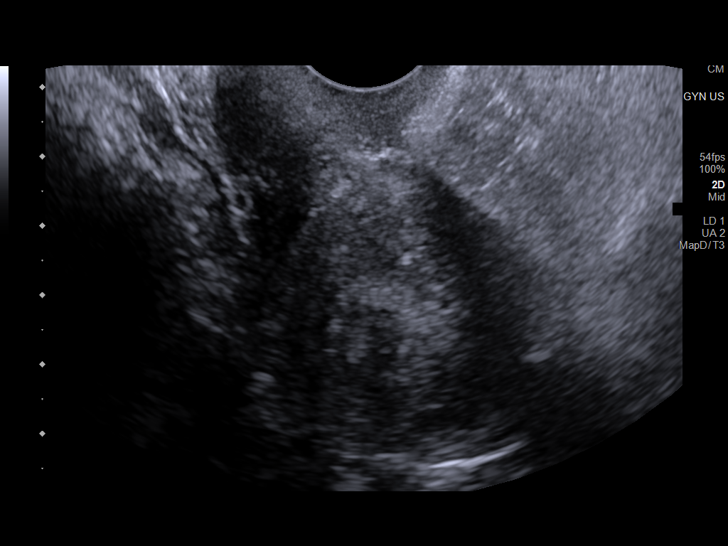
[im 63/95]
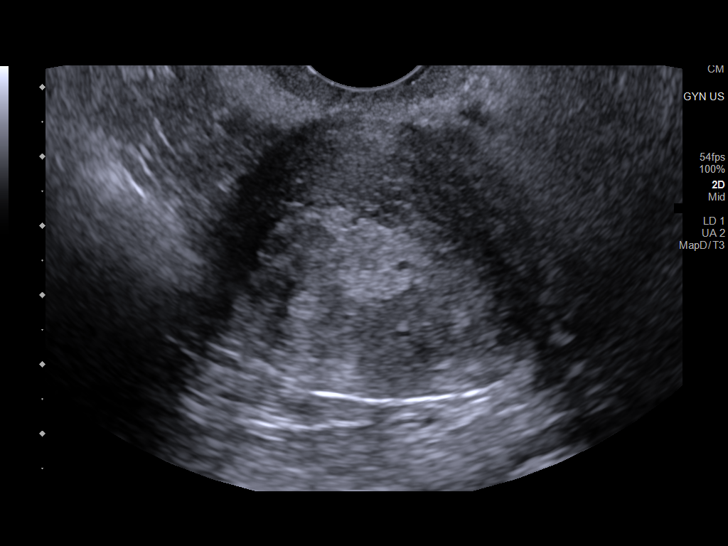
[im 71/95]
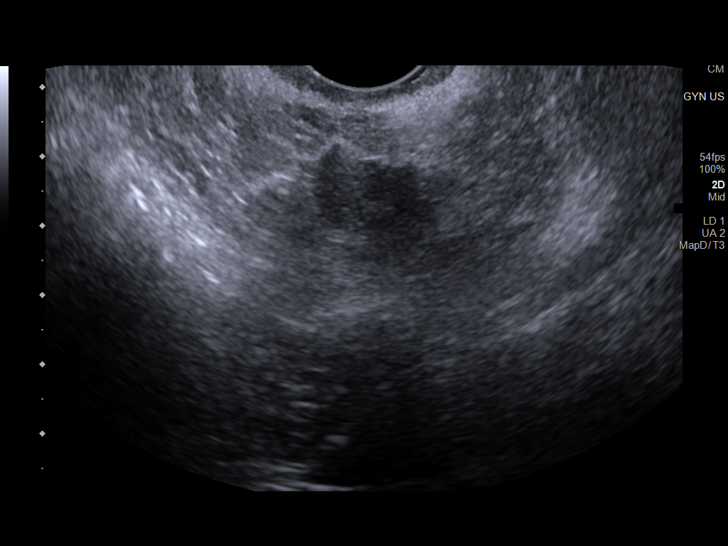
[im 79/95]
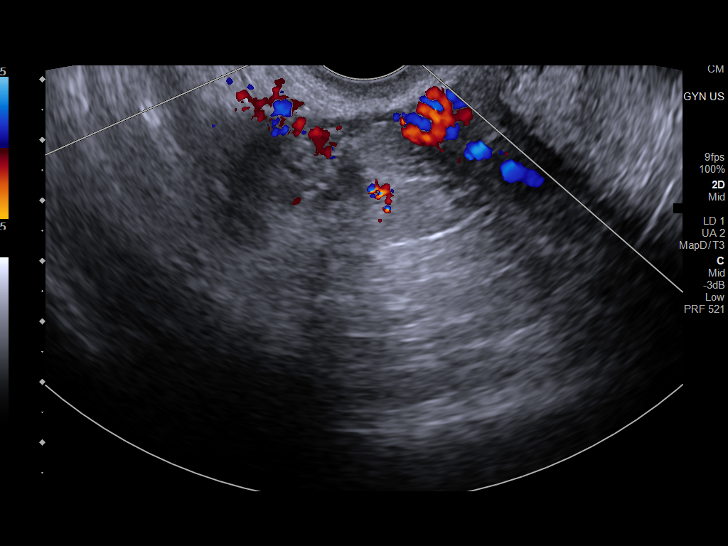
[im 87/95]
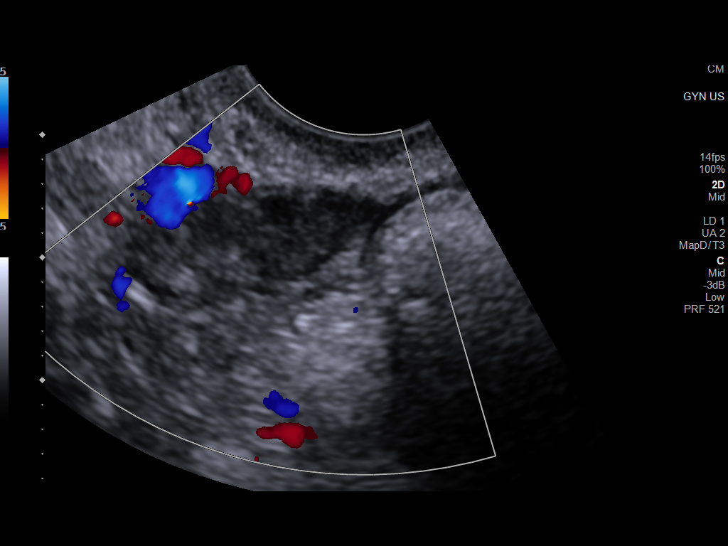
[im 95/95]
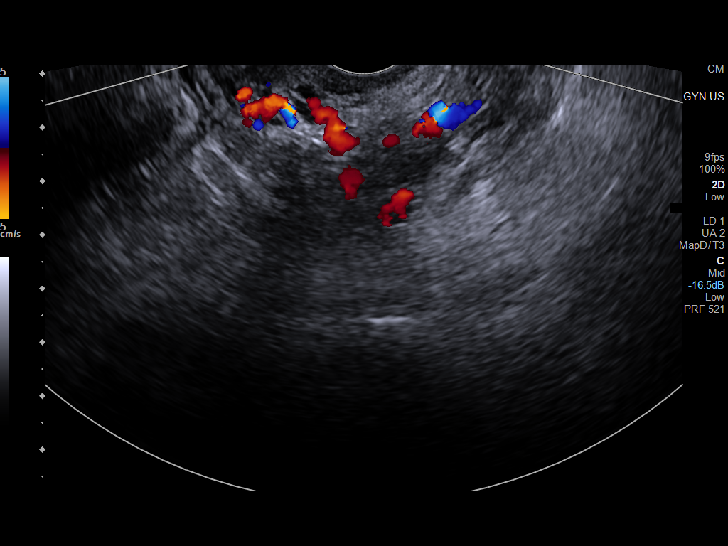

[13 of 25 positions shown; findings below may reference images not displayed]

FINDINGS: Uterus

Measurements: 6.5 x 3.7 x 4.9 cm = volume: 61.0 mL. Uterus is
retroverted. Multiple ill-defined echogenic striations seen within
the uterine myometrium with somewhat poor definition of the
endometrial-myometrial interface. Few scattered superimposed
subendometrial microcystic changes noted. Findings suggestive of
uterine adenomyosis. No discrete fibroid or other mass.

Endometrium

Thickness: 13 mm. Scattered areas of increased vascularity seen
within the thickened endometrial complex. No definite discrete
lesion identified.

Right ovary

Measurements: 1.9 x 1.9 x 0.7 cm = volume: 1.1 mL. Few punctate
echogenic foci in noted, likely calcifications, of doubtful
significance. No adnexal mass.

Left ovary

Not visualized.  No adnexal mass.

Other findings

No abnormal free fluid.
IMPRESSION: 1. Endometrial complex abnormally thickened up to 13 mm with
associated areas of increased vascularity. In the setting of
post-menopausal bleeding, endometrial sampling is indicated to
exclude carcinoma. If results are benign, sonohysterogram should be
considered for focal lesion work-up. (Ref: Radiological Reasoning:
Algorithmic Workup of Abnormal Vaginal Bleeding with Endovaginal
Sonography and Sonohysterography. AJR [id number]; 191:S68-73).
2. Suspected underlying uterine adenomyosis. No discrete fibroid
identified.
3. Normal right ovary, with nonvisualization of the left ovary. No
adnexal mass or free fluid.

## 2023-06-27 IMAGING — DX DG CHEST 2V
2 series · 2 of 2 positions shown · non-contrast
Comparison: No plain film for comparison

CLINICAL DATA: 61-year-old female with cough

EXAM:
CHEST - 2 VIEW

[chest pa]
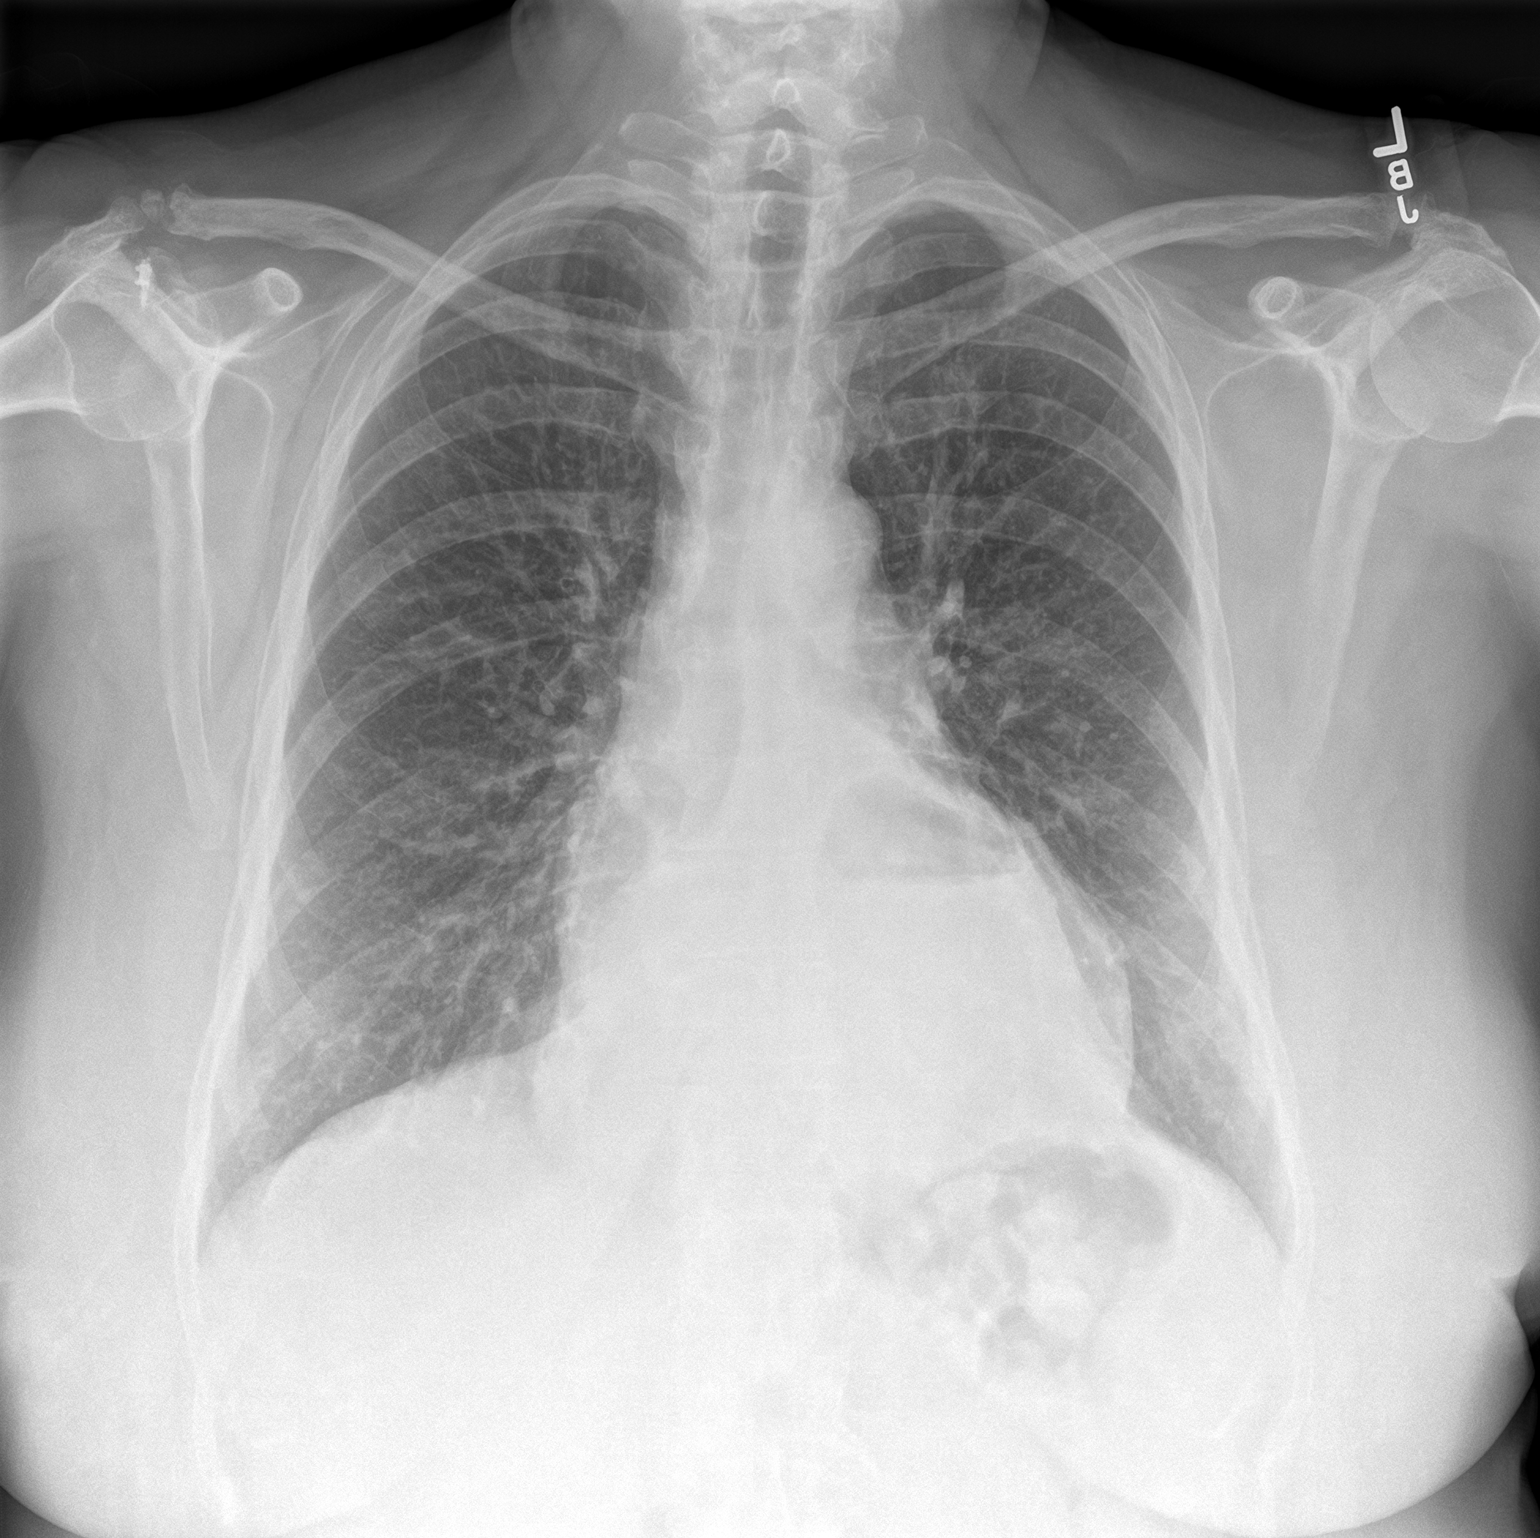

[chest lat]
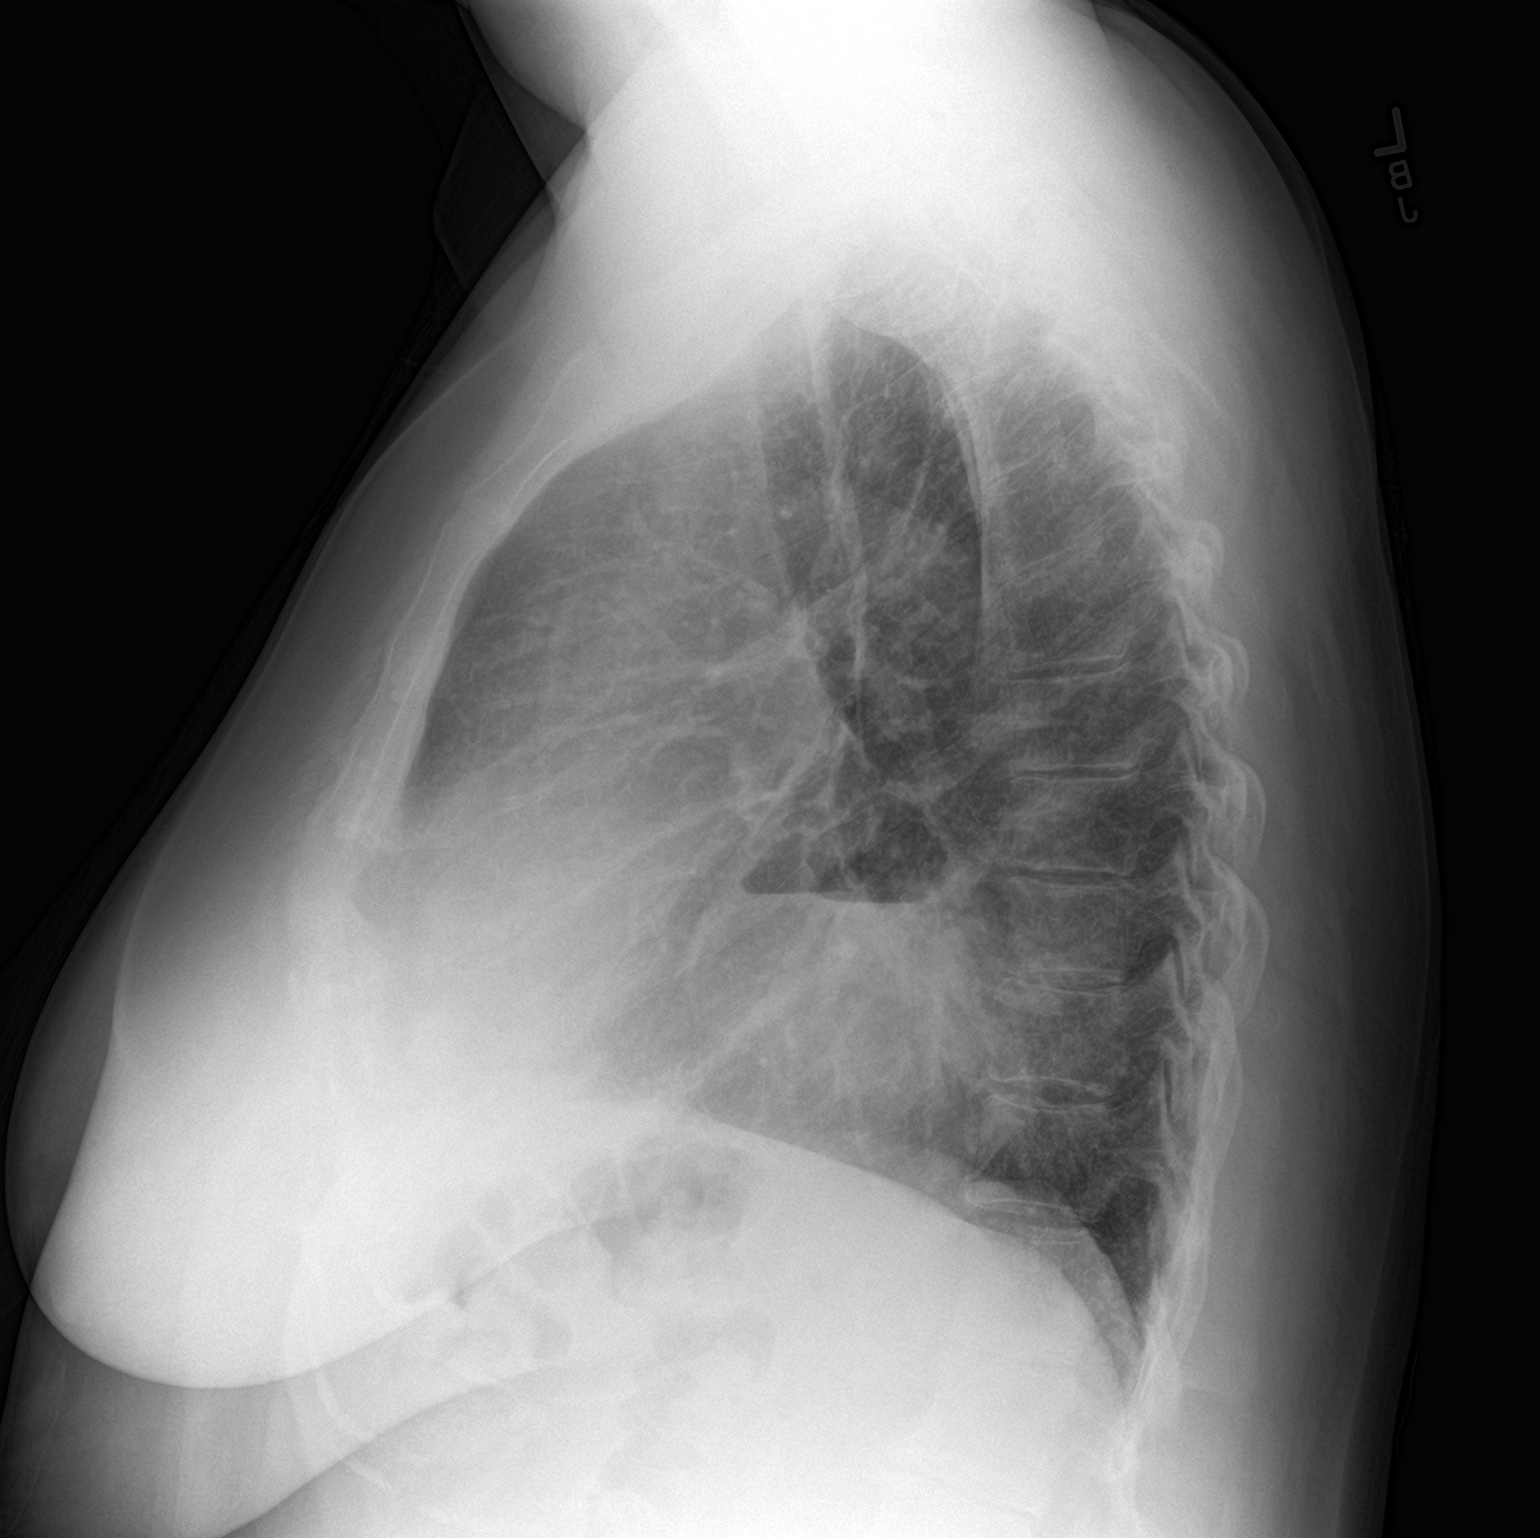

[2 of 2 positions shown; findings below may reference images not displayed]

FINDINGS: Cardiomediastinal silhouette within normal limits in size and
contour. No evidence of central vascular congestion. No interlobular
septal thickening.

Double density projects over the lower mediastinum with air-fluid
level.

No pneumothorax or pleural effusion. Coarsened interstitial
markings, with no confluent airspace disease.

No acute displaced fracture. Degenerative changes of the spine.

Surgical changes of the right proximal humerus. Degenerative changes
of the right AC joint.
IMPRESSION: Negative for acute cardiopulmonary disease.

Large hiatal hernia
